# Patient Record
Sex: Female | Born: 1995 | Hispanic: Yes | Marital: Single | Smoking: Never smoker
Health system: Southern US, Community
[De-identification: ages and names within clinical notes are randomized; demographics above are authoritative.]

---
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
## 2014-12-13 ENCOUNTER — Encounter (HOSPITAL_BASED_OUTPATIENT_CLINIC_OR_DEPARTMENT_OTHER): Payer: Self-pay | Admitting: Adult Health

## 2014-12-13 ENCOUNTER — Emergency Department (HOSPITAL_BASED_OUTPATIENT_CLINIC_OR_DEPARTMENT_OTHER): Payer: Medicaid Other

## 2014-12-13 ENCOUNTER — Emergency Department (HOSPITAL_BASED_OUTPATIENT_CLINIC_OR_DEPARTMENT_OTHER)
Admission: EM | Admit: 2014-12-13 | Discharge: 2014-12-14 | Disposition: A | Discharge status: Home / Self Care | Payer: Medicaid Other | Attending: Emergency Medicine | Admitting: Emergency Medicine

## 2014-12-13 DIAGNOSIS — K5904 Chronic idiopathic constipation: Secondary | ICD-10-CM

## 2014-12-13 DIAGNOSIS — R1084 Generalized abdominal pain: Secondary | ICD-10-CM | POA: Diagnosis present

## 2014-12-13 DIAGNOSIS — Z792 Long term (current) use of antibiotics: Secondary | ICD-10-CM | POA: Insufficient documentation

## 2014-12-13 DIAGNOSIS — Z3202 Encounter for pregnancy test, result negative: Secondary | ICD-10-CM | POA: Insufficient documentation

## 2014-12-13 DIAGNOSIS — B372 Candidiasis of skin and nail: Secondary | ICD-10-CM | POA: Insufficient documentation

## 2014-12-13 DIAGNOSIS — N390 Urinary tract infection, site not specified: Secondary | ICD-10-CM | POA: Insufficient documentation

## 2014-12-13 DIAGNOSIS — K5909 Other constipation: Secondary | ICD-10-CM | POA: Insufficient documentation

## 2014-12-13 DIAGNOSIS — B369 Superficial mycosis, unspecified: Secondary | ICD-10-CM

## 2014-12-13 LAB — PREGNANCY, URINE: Preg Test, Ur: NEGATIVE

## 2014-12-13 MED ORDER — GI COCKTAIL ~~LOC~~
30.0000 mL | Freq: Once | ORAL | Status: AC
  Administered 2014-12-14: 30 mL via ORAL
  Filled 2014-12-13: qty 30

## 2014-12-13 MED ORDER — ONDANSETRON HCL 4 MG/2ML IJ SOLN: 4.0000 mg | Freq: Once | INTRAMUSCULAR | Status: DC | PRN

## 2014-12-13 MED ORDER — DICYCLOMINE HCL 10 MG/ML IM SOLN
20.0000 mg | Freq: Once | INTRAMUSCULAR | Status: AC
  Administered 2014-12-14: 20 mg via INTRAMUSCULAR
  Filled 2014-12-13: qty 2

## 2014-12-13 MED ORDER — ONDANSETRON 8 MG PO TBDP
8.0000 mg | ORAL_TABLET | Freq: Once | ORAL | Status: AC
  Administered 2014-12-14: 8 mg via ORAL
  Filled 2014-12-13: qty 1

## 2014-12-13 NOTE — ED Provider Notes (Signed)
CSN: [id number]     Arrival date & time 12/13/14  2310 History  This chart was scribed for Ziasia Lenoir, MD by Budd Palmer, ED Scribe. This patient was seen in room MH01/MH01 and the patient's care was started at 11:30 PM.    Chief Complaint  Patient presents with  . Abdominal Pain   Patient is a 19 y.o. female presenting with abdominal pain. The history is provided by the patient. No language interpreter was used.  Abdominal Pain Pain location:  Generalized Pain quality: aching and cramping   Pain radiates to:  Does not radiate Pain severity:  Moderate Onset quality:  Gradual Timing:  Constant Progression:  Worsening Chronicity:  New Context: not recent travel and not trauma   Relieved by:  None tried Worsened by:  Nothing tried Ineffective treatments:  None tried Associated symptoms: nausea   Associated symptoms: no constipation, no diarrhea, no dysuria, no fever, no vaginal bleeding and no vaginal discharge   Risk factors: has not had multiple surgeries    HPI Comments: Krystal Dunn is a 19 y.o. female who presents to the Emergency Department complaining of abdominal pain onset this morning. She reports associated erythema around the umbilicus, and intermittent nausea.. She is on BCP. Her LNMP lasted 2 weeks. Pt denies diarrhea, constipation, urinary symptoms and vaginal discharge.  History reviewed. No pertinent past medical history. History reviewed. No pertinent past surgical history. History reviewed. No pertinent family history. History  Substance Use Topics  . Smoking status: Never Smoker   . Smokeless tobacco: Not on file  . Alcohol Use: No   OB History    No data available     Review of Systems  Constitutional: Negative for fever.  Gastrointestinal: Positive for nausea and abdominal pain. Negative for diarrhea and constipation.  Genitourinary: Negative for dysuria, urgency, frequency, vaginal bleeding, vaginal discharge, difficulty urinating and pelvic  pain.  All other systems reviewed and are negative.   Allergies  Review of patient's allergies indicates no known allergies.  Home Medications   Prior to Admission medications   Medication Sig Start Date End Date Taking? Authorizing Provider  benzonatate (TESSALON) 100 MG capsule Take 1 capsule (100 mg total) by mouth every 8 (eight) hours. 04/12/13   Hope Orlene Och, NP  erythromycin ophthalmic ointment Place a 1/2 inch ribbon of ointment into the lower eyelid bid. 05/24/13   Breshay Ilg, MD  ibuprofen (ADVIL,MOTRIN) 600 MG tablet Take 1 tablet (600 mg total) by mouth every 6 (six) hours as needed. 05/24/13   Zylie Mumaw, MD  metroNIDAZOLE (FLAGYL) 500 MG tablet Take 1 tablet (500 mg total) by mouth 2 (two) times daily. One po bid x 7 days 08/14/13   Elwin Mocha, MD  ondansetron (ZOFRAN ODT) 4 MG disintegrating tablet Take 1 tablet (4 mg total) by mouth every 8 (eight) hours as needed for nausea or vomiting. 10/14/13   Teressa Lower, NP  ondansetron (ZOFRAN) 4 MG tablet Take 1 tablet (4 mg total) by mouth every 6 (six) hours. 04/12/13   Hope Orlene Och, NP  sulfamethoxazole-trimethoprim (SEPTRA DS) 800-160 MG per tablet Take 1 tablet by mouth every 12 (twelve) hours. 04/12/13   Hope Orlene Och, NP   BP 110/71 mmHg  Pulse 70  Temp(Src) 98.3 F (36.8 C) (Oral)  Resp 18  Wt 125 lb (56.7 kg)  SpO2 100% Physical Exam  Constitutional: She is oriented to person, place, and time. She appears well-developed and well-nourished. No distress.  HENT:  Head: Normocephalic and  atraumatic.  Mouth/Throat: Oropharynx is clear and moist.  Eyes: Conjunctivae and EOM are normal. Pupils are equal, round, and reactive to light.  Neck: Normal range of motion. Neck supple. No tracheal deviation present.  Cardiovascular: Normal rate, regular rhythm and normal heart sounds.   Pulmonary/Chest: Effort normal and breath sounds normal. No respiratory distress.  Lungs are clear to auscultation  Abdominal: Soft. There is  no tenderness. There is no rebound and no guarding.  Exceptionally gassy throughout. Palpable stool throughout the colon, mainly descending.  Musculoskeletal: Normal range of motion. She exhibits no edema or tenderness.  Neurological: She is alert and oriented to person, place, and time.  Skin: Skin is warm and dry.  Psychiatric: She has a normal mood and affect. Her behavior is normal.  Nursing note and vitals reviewed.   ED Course  Procedures  DIAGNOSTIC STUDIES: Oxygen Saturation is 100% on RA, normal by my interpretation.    COORDINATION OF CARE: 11:36 PM - Discussed umbilical yeast infection. Discussed plans to order antifungal cream and diagnostic studies. Pt advised of plan for treatment and pt agrees.  Labs Review Labs Reviewed  URINALYSIS, ROUTINE W REFLEX MICROSCOPIC (NOT AT [facility]) - Abnormal; Notable for the following:    APPearance CLOUDY (*)    Nitrite POSITIVE (*)    Leukocytes, UA SMALL (*)    All other components within normal limits  URINE MICROSCOPIC-ADD ON - Abnormal; Notable for the following:    Squamous Epithelial / LPF FEW (*)    Bacteria, UA MANY (*)    All other components within normal limits  PREGNANCY, URINE    Imaging Review No results found.   EKG Interpretation None      MDM   Final diagnoses:  None    Fungal infection of the umbilicus. Patient also has cramping and is extremely gassy on exam.  Constipation on exam and Xray.  Will treat fungal infection with clotrimazole cream and constipation with miralax.  Have added macrobid for UTI.    I personally performed the services described in this documentation, which was scribed in my presence. The recorded information has been reviewed and is accurate.    Cy Blamer, MD 12/14/14 [phone]

## 2014-12-13 NOTE — ED Notes (Signed)
Presents with abdominal pain at umbilicus-and on lower left side-associated with nausea and dizziness. denuis diarrhea and constipation. Pain began this AM. Inner belly button is red-she states, "It hurts and is tender around the belly button".

## 2014-12-14 ENCOUNTER — Encounter (HOSPITAL_BASED_OUTPATIENT_CLINIC_OR_DEPARTMENT_OTHER): Payer: Self-pay | Admitting: Emergency Medicine

## 2014-12-14 LAB — URINALYSIS, ROUTINE W REFLEX MICROSCOPIC
BILIRUBIN URINE: NEGATIVE
Glucose, UA: NEGATIVE mg/dL
HGB URINE DIPSTICK: NEGATIVE
Ketones, ur: NEGATIVE mg/dL
Nitrite: POSITIVE — AB
PH: 6 (ref 5.0–8.0)
Protein, ur: NEGATIVE mg/dL
SPECIFIC GRAVITY, URINE: 1.029 (ref 1.005–1.030)
Urobilinogen, UA: 0.2 mg/dL (ref 0.0–1.0)

## 2014-12-14 LAB — URINE MICROSCOPIC-ADD ON

## 2014-12-14 MED ORDER — NITROFURANTOIN MONOHYD MACRO 100 MG PO CAPS
100.0000 mg | ORAL_CAPSULE | Freq: Once | ORAL | Status: AC
  Administered 2014-12-14: 100 mg via ORAL
  Filled 2014-12-14: qty 1

## 2014-12-14 MED ORDER — NITROFURANTOIN MONOHYD MACRO 100 MG PO CAPS
100.0000 mg | ORAL_CAPSULE | Freq: Two times a day (BID) | ORAL | Status: DC
Start: 2014-12-14 — End: 2019-01-26

## 2014-12-14 MED ORDER — POLYETHYLENE GLYCOL 3350 17 GM/SCOOP PO POWD: 17.0000 g | Freq: Every day | ORAL | Status: DC

## 2014-12-14 MED ORDER — CLOTRIMAZOLE 1 % EX CREA: TOPICAL_CREAM | CUTANEOUS | Status: DC

## 2016-07-28 ENCOUNTER — Emergency Department (HOSPITAL_BASED_OUTPATIENT_CLINIC_OR_DEPARTMENT_OTHER)
Admission: EM | Admit: 2016-07-28 | Discharge: 2016-07-28 | Disposition: A | Discharge status: Home / Self Care | Payer: Medicaid Other | Attending: Emergency Medicine | Admitting: Emergency Medicine

## 2016-07-28 ENCOUNTER — Encounter (HOSPITAL_BASED_OUTPATIENT_CLINIC_OR_DEPARTMENT_OTHER): Payer: Self-pay | Admitting: *Deleted

## 2016-07-28 DIAGNOSIS — Y9389 Activity, other specified: Secondary | ICD-10-CM | POA: Diagnosis not present

## 2016-07-28 DIAGNOSIS — R1030 Lower abdominal pain, unspecified: Secondary | ICD-10-CM | POA: Diagnosis not present

## 2016-07-28 DIAGNOSIS — S3992XA Unspecified injury of lower back, initial encounter: Secondary | ICD-10-CM | POA: Diagnosis present

## 2016-07-28 DIAGNOSIS — Z79899 Other long term (current) drug therapy: Secondary | ICD-10-CM | POA: Insufficient documentation

## 2016-07-28 DIAGNOSIS — Z5321 Procedure and treatment not carried out due to patient leaving prior to being seen by health care provider: Secondary | ICD-10-CM | POA: Diagnosis not present

## 2016-07-28 DIAGNOSIS — Y92410 Unspecified street and highway as the place of occurrence of the external cause: Secondary | ICD-10-CM | POA: Diagnosis not present

## 2016-07-28 DIAGNOSIS — M549 Dorsalgia, unspecified: Secondary | ICD-10-CM | POA: Diagnosis not present

## 2016-07-28 DIAGNOSIS — Y999 Unspecified external cause status: Secondary | ICD-10-CM | POA: Insufficient documentation

## 2016-07-28 NOTE — ED Notes (Signed)
Patient upset that she is having to wait. Sitting in the wheelchair, when a new patient being brought back she is upset that she has not been taken straight back. Patient is sitting in wheelchair, texting  / looking on her phone and appears to not be in any distress.
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
## 2016-07-28 NOTE — ED Notes (Signed)
Patient approached nurses station, states she does not have time to stay and complete her evaluation, as she has to go pick up her child from school. Patient is A&Ox4, NAD noted. Ambualtory with out difficulty. Patient was advised she can return at anytime for additional evaluation.

## 2016-09-11 ENCOUNTER — Emergency Department (HOSPITAL_BASED_OUTPATIENT_CLINIC_OR_DEPARTMENT_OTHER)
Admission: EM | Admit: 2016-09-11 | Discharge: 2016-09-11 | Disposition: A | Discharge status: Home / Self Care | Payer: Medicaid Other | Attending: Emergency Medicine | Admitting: Emergency Medicine

## 2016-09-11 ENCOUNTER — Encounter (HOSPITAL_BASED_OUTPATIENT_CLINIC_OR_DEPARTMENT_OTHER): Payer: Self-pay | Admitting: Emergency Medicine

## 2016-09-11 DIAGNOSIS — J3089 Other allergic rhinitis: Secondary | ICD-10-CM | POA: Insufficient documentation

## 2016-09-11 DIAGNOSIS — R490 Dysphonia: Secondary | ICD-10-CM | POA: Diagnosis present

## 2016-09-11 MED ORDER — FLUTICASONE PROPIONATE 50 MCG/ACT NA SUSP: 2.0000 | Freq: Every day | NASAL | 0 refills | Status: DC

## 2016-09-11 MED ORDER — CETIRIZINE HCL 10 MG PO TABS: 10.0000 mg | ORAL_TABLET | Freq: Every day | ORAL | 1 refills | Status: DC

## 2016-09-11 NOTE — ED Provider Notes (Signed)
[facility] Provider Note   CSN: [id number] Arrival date & time: 09/11/16  1659  By signing my name below, I, Nelwyn Salisbury, attest that this documentation has been prepared under the direction and in the presence of non-physician practitioner, Everlene Farrier, PA-C. Electronically Signed: Nelwyn Salisbury, Scribe. 09/11/2016. 5:41 PM.  History   Chief Complaint Chief Complaint  Patient presents with  . Hoarse   The history is provided by the patient. No language interpreter was used.    HPI Comments:  Krystal Dunn is an otherwise healthy 21 y.o. female who presents to the Emergency Department complaining of sudden-onset, unchanged, lost voice and hoarseness onset 3 days. Pt reports associated rhinorrhea, sneezing, nasal congestion and post nasal drip. She reports this happens every year around this time.  She has tried Chief [employer] and Nyquil at home with no relief. Denies any fever, sore throat, cough or SOB.   History reviewed. No pertinent past medical history.  There are no active problems to display for this patient.   History reviewed. No pertinent surgical history.  OB History    No data available       Home Medications    Prior to Admission medications   Medication Sig Start Date End Date Taking? Authorizing Provider  benzonatate (TESSALON) 100 MG capsule Take 1 capsule (100 mg total) by mouth every 8 (eight) hours. 04/12/13   Janne Napoleon, NP  cetirizine (ZYRTEC ALLERGY) 10 MG tablet Take 1 tablet (10 mg total) by mouth daily. 09/11/16   Everlene Farrier, PA-C  clotrimazole (LOTRIMIN) 1 % cream Apply to affected area 2 times daily 12/14/14   Palumbo, April, MD  erythromycin ophthalmic ointment Place a 1/2 inch ribbon of ointment into the lower eyelid bid. 05/24/13   Palumbo, April, MD  fluticasone (FLONASE) 50 MCG/ACT nasal spray Place 2 sprays into both nostrils daily. 09/11/16   Everlene Farrier, PA-C  ibuprofen (ADVIL,MOTRIN) 600 MG tablet Take 1 tablet (600 mg total)  by mouth every 6 (six) hours as needed. 05/24/13   Palumbo, April, MD  metroNIDAZOLE (FLAGYL) 500 MG tablet Take 1 tablet (500 mg total) by mouth 2 (two) times daily. One po bid x 7 days 08/14/13   Elwin Mocha, MD  nitrofurantoin, macrocrystal-monohydrate, (MACROBID) 100 MG capsule Take 1 capsule (100 mg total) by mouth 2 (two) times daily. X 7 days 12/14/14   Palumbo, April, MD  ondansetron (ZOFRAN ODT) 4 MG disintegrating tablet Take 1 tablet (4 mg total) by mouth every 8 (eight) hours as needed for nausea or vomiting. 10/14/13   Teressa Lower, NP  ondansetron (ZOFRAN) 4 MG tablet Take 1 tablet (4 mg total) by mouth every 6 (six) hours. 04/12/13   Janne Napoleon, NP  polyethylene glycol powder (MIRALAX) powder Take 17 g by mouth daily. 12/14/14   Palumbo, April, MD  sulfamethoxazole-trimethoprim (SEPTRA DS) 800-160 MG per tablet Take 1 tablet by mouth every 12 (twelve) hours. 04/12/13   Janne Napoleon, NP    Family History History reviewed. No pertinent family history.  Social History Social History  Substance Use Topics  . Smoking status: Never Smoker  . Smokeless tobacco: Never Used  . Alcohol use No     Allergies   Patient has no known allergies.   Review of Systems Review of Systems  Constitutional: Negative for fever.  HENT: Positive for congestion, postnasal drip, rhinorrhea and voice change. Negative for sore throat and trouble swallowing.   Eyes: Negative for visual disturbance.  Respiratory: Negative for  cough and shortness of breath.   Skin: Negative for rash.  Neurological: Negative for light-headedness and headaches.     Physical Exam Updated Vital Signs BP 113/75 (BP Location: Right Arm)   Pulse 63   Temp 98.7 F (37.1 C) (Oral)   Resp 18   Ht 5\' 2"  (1.575 m)   Wt 59 kg   LMP 08/28/2016   SpO2 100%   BMI 23.78 kg/m   Physical Exam  Constitutional: She appears well-developed and well-nourished. No distress.  Nontoxic appearing  HENT:  Head: Normocephalic  and atraumatic.  Right Ear: Tympanic membrane and external ear normal.  Left Ear: Tympanic membrane and external ear normal.  Mouth/Throat: No oropharyngeal exudate.  No TM erythema or loss of landmarks.  Boggy nasal turbinates and rhinorrhea present bilaterally. No tonsillar hypertrophy or exudates. Some cobblestoning with evidence of postnasal drip. Uvula midline. No edema. No PTA. No drooling. No trismus.   Eyes: Conjunctivae are normal. Pupils are equal, round, and reactive to light. Right eye exhibits no discharge. Left eye exhibits no discharge.  Neck: Normal range of motion. Neck supple. No JVD present. No tracheal deviation present.  Cardiovascular: Normal rate, regular rhythm, normal heart sounds and intact distal pulses.   Pulmonary/Chest: Effort normal and breath sounds normal. No stridor. No respiratory distress. She has no wheezes. She has no rales.  Abdominal: Soft. There is no tenderness.  Lymphadenopathy:    She has no cervical adenopathy.  Neurological: She is alert. Coordination normal.  Skin: Skin is warm and dry. Capillary refill takes less than 2 seconds. No rash noted. She is not diaphoretic. No erythema. No pallor.  Psychiatric: She has a normal mood and affect. Her behavior is normal.  Nursing note and vitals reviewed.    ED Treatments / Results  DIAGNOSTIC STUDIES:  Oxygen Saturation is 100% on RA, normal by my interpretation.    COORDINATION OF CARE:  6:03 PM Discussed treatment plan with pt at bedside which includes nasal spray and pt agreed to plan.  Labs (all labs ordered are listed, but only abnormal results are displayed) Labs Reviewed - No data to display  EKG  EKG Interpretation None       Radiology No results found.  Procedures Procedures (including critical care time)  Medications Ordered in ED Medications - No data to display   Initial Impression / Assessment and Plan / ED Course  I have reviewed the triage vital signs and the  nursing notes.  Pertinent labs & imaging results that were available during my care of the patient were reviewed by me and considered in my medical decision making (see chart for details).    This is an otherwise healthy 21 y.o. female who presents to the Emergency Department complaining of sudden-onset, unchanged, lost voice and hoarseness onset 3 days. Pt reports associated rhinorrhea, sneezing, nasal congestion and post nasal drip. She reports this happens every year around this time. On exam patient is afebrile nontoxic appearing. She has evidence of allergic rhinitis with postnasal drip. Suspect this is the cause of her loss of voice. No evidence of peritonsillar abscess. She has no fevers. She denies sore throat. We'll discharge a prescription for Flonase, and Zyrtec. I encouraged her to push oral liquids. I advised the patient to follow-up with their primary care provider this week. I advised the patient to return to the emergency department with new or worsening symptoms or new concerns. The patient verbalized understanding and agreement with plan.  Final Clinical Impressions(s) / ED Diagnoses   Final diagnoses:  Seasonal allergic rhinitis due to other allergic trigger  Hoarse    New Prescriptions New Prescriptions   CETIRIZINE (ZYRTEC ALLERGY) 10 MG TABLET    Take 1 tablet (10 mg total) by mouth daily.   FLUTICASONE (FLONASE) 50 MCG/ACT NASAL SPRAY    Place 2 sprays into both nostrils daily.    I personally performed the services described in this documentation, which was scribed in my presence. The recorded information has been reviewed and is accurate.       Everlene FarrierDansie, Jaylene Schrom, PA-C 09/11/16 Delton [id number]    Long, Joshua G, MD 09/12/16 [phone]

## 2016-09-11 NOTE — ED Triage Notes (Signed)
Patient states that she has had a horse voice x 3 days. Reports that she has this every year - denies any pain or other complaints other than loosing her voice

## 2016-09-11 NOTE — ED Notes (Signed)
Pt given rx x 2 and note for work

## 2017-07-31 ENCOUNTER — Emergency Department (HOSPITAL_BASED_OUTPATIENT_CLINIC_OR_DEPARTMENT_OTHER)
Admission: EM | Admit: 2017-07-31 | Discharge: 2017-07-31 | Disposition: A | Discharge status: Home / Self Care | Payer: Medicaid Other | Attending: Emergency Medicine | Admitting: Emergency Medicine

## 2017-07-31 ENCOUNTER — Encounter (HOSPITAL_BASED_OUTPATIENT_CLINIC_OR_DEPARTMENT_OTHER): Payer: Self-pay | Admitting: Emergency Medicine

## 2017-07-31 ENCOUNTER — Other Ambulatory Visit: Payer: Self-pay

## 2017-07-31 DIAGNOSIS — Z79899 Other long term (current) drug therapy: Secondary | ICD-10-CM | POA: Diagnosis not present

## 2017-07-31 DIAGNOSIS — J069 Acute upper respiratory infection, unspecified: Secondary | ICD-10-CM | POA: Insufficient documentation

## 2017-07-31 DIAGNOSIS — J029 Acute pharyngitis, unspecified: Secondary | ICD-10-CM | POA: Diagnosis present

## 2017-07-31 DIAGNOSIS — B349 Viral infection, unspecified: Secondary | ICD-10-CM | POA: Diagnosis not present

## 2017-07-31 LAB — RAPID STREP SCREEN (MED CTR MEBANE ONLY): Streptococcus, Group A Screen (Direct): NEGATIVE

## 2017-07-31 MED ORDER — IBUPROFEN 800 MG PO TABS: 800.0000 mg | ORAL_TABLET | Freq: Three times a day (TID) | ORAL | 0 refills | Status: DC | PRN

## 2017-07-31 MED ORDER — FLUTICASONE PROPIONATE 50 MCG/ACT NA SUSP: 1.0000 | Freq: Every day | NASAL | 2 refills | Status: DC

## 2017-07-31 NOTE — ED Triage Notes (Signed)
Patient states that she is having generalized pain and sore throat starting on Saturday

## 2017-07-31 NOTE — ED Provider Notes (Signed)
[facility] EMERGENCY DEPARTMENT Provider Note   CSN: [id number] Arrival date & time: 07/31/17  1831     History   Chief Complaint Chief Complaint  Patient presents with  . Sore Throat    HPI Krystal Dunn is a 22 y.o. female without significant past medical history presents the emergency department complaining of URI symptoms for the past 4 days.  Patient states she is experiencing congestion, rhinorrhea, sore throat, subjective fevers, and bilateral ear pressure.  Patient has not tried anything for symptoms at home.  No specific alleviating or aggravating factors.  Denies  cough, dyspnea, or difficulty swallowing.  HPI  History reviewed. No pertinent past medical history.  There are no active problems to display for this patient.   History reviewed. No pertinent surgical history.   OB History   None      Home Medications    Prior to Admission medications   Medication Sig Start Date End Date Taking? Authorizing Provider  benzonatate (TESSALON) 100 MG capsule Take 1 capsule (100 mg total) by mouth every 8 (eight) hours. 04/12/13   Janne NapoleonNeese, Hope M, NP  cetirizine (ZYRTEC ALLERGY) 10 MG tablet Take 1 tablet (10 mg total) by mouth daily. 09/11/16   Everlene Farrieransie, William, PA-C  clotrimazole (LOTRIMIN) 1 % cream Apply to affected area 2 times daily 12/14/14   Palumbo, April, MD  erythromycin ophthalmic ointment Place a 1/2 inch ribbon of ointment into the lower eyelid bid. 05/24/13   Palumbo, April, MD  fluticasone (FLONASE) 50 MCG/ACT nasal spray Place 2 sprays into both nostrils daily. 09/11/16   Everlene Farrieransie, William, PA-C  ibuprofen (ADVIL,MOTRIN) 600 MG tablet Take 1 tablet (600 mg total) by mouth every 6 (six) hours as needed. 05/24/13   Palumbo, April, MD  metroNIDAZOLE (FLAGYL) 500 MG tablet Take 1 tablet (500 mg total) by mouth 2 (two) times daily. One po bid x 7 days 08/14/13   Elwin MochaWalden, Blair, MD  nitrofurantoin, macrocrystal-monohydrate, (MACROBID) 100 MG capsule Take 1 capsule  (100 mg total) by mouth 2 (two) times daily. X 7 days 12/14/14   Palumbo, April, MD  ondansetron (ZOFRAN ODT) 4 MG disintegrating tablet Take 1 tablet (4 mg total) by mouth every 8 (eight) hours as needed for nausea or vomiting. 10/14/13   Teressa LowerPickering, Vrinda, NP  ondansetron (ZOFRAN) 4 MG tablet Take 1 tablet (4 mg total) by mouth every 6 (six) hours. 04/12/13   Janne NapoleonNeese, Hope M, NP  polyethylene glycol powder (MIRALAX) powder Take 17 g by mouth daily. 12/14/14   Palumbo, April, MD  sulfamethoxazole-trimethoprim (SEPTRA DS) 800-160 MG per tablet Take 1 tablet by mouth every 12 (twelve) hours. 04/12/13   Janne NapoleonNeese, Hope M, NP    Family History History reviewed. No pertinent family history.  Social History Social History   Tobacco Use  . Smoking status: Never Smoker  . Smokeless tobacco: Never Used  Substance Use Topics  . Alcohol use: No  . Drug use: No     Allergies   Patient has no known allergies.   Review of Systems Review of Systems  Constitutional: Positive for fever (subjective). Negative for chills.  HENT: Positive for congestion, ear pain (Bilateral), rhinorrhea and sore throat. Negative for ear discharge and trouble swallowing.   Respiratory: Negative for cough and shortness of breath.   Cardiovascular: Negative for chest pain.     Physical Exam Updated Vital Signs BP 117/72   Pulse 79   Temp 98.4 F (36.9 C) (Oral)   Resp 18  Ht 5\' 2"  (1.575 m)   Wt 61.2 kg (135 lb)   LMP 07/31/2017   SpO2 100%   BMI 24.69 kg/m   Physical Exam  Constitutional: She appears well-developed and well-nourished.  Non-toxic appearance. No distress.  HENT:  Head: Normocephalic and atraumatic.  Right Ear: Tympanic membrane is not perforated, not erythematous, not retracted and not bulging.  Left Ear: Tympanic membrane is not perforated, not erythematous, not retracted and not bulging.  Nose: Mucosal edema (And congestion) present. Right sinus exhibits no maxillary sinus tenderness and no  frontal sinus tenderness. Left sinus exhibits no maxillary sinus tenderness and no frontal sinus tenderness.  Mouth/Throat: Uvula is midline. Posterior oropharyngeal erythema present. No oropharyngeal exudate. Tonsils are 2+ on the right. Tonsils are 2+ on the left.  Patient is tolerating her own secretions without difficulty.  No hot potato voice.  No drooling.  No trismus.  Some mandibular compartment is soft  Eyes: Pupils are equal, round, and reactive to light. Conjunctivae are normal. Right eye exhibits no discharge. Left eye exhibits no discharge.  Neck: Normal range of motion. Neck supple.  Cardiovascular: Normal rate and regular rhythm.  No murmur heard. Pulmonary/Chest: Breath sounds normal. No respiratory distress. She has no wheezes. She has no rales.  Abdominal: Soft. She exhibits no distension. There is no tenderness.  Lymphadenopathy:    She has cervical adenopathy (Anterior).  Neurological: She is alert.  Skin: Skin is warm and dry. No rash noted.  Psychiatric: She has a normal mood and affect. Her behavior is normal.  Nursing note and vitals reviewed.    ED Treatments / Results  Labs Results for orders placed or performed during the hospital encounter of 07/31/17  Rapid strep screen  Result Value Ref Range   Streptococcus, Group A Screen (Direct) NEGATIVE NEGATIVE   No results found.EKG None  Radiology No results found.  Procedures Procedures (including critical care time)  Medications Ordered in ED Medications - No data to display   Initial Impression / Assessment and Plan / ED Course  I have reviewed the triage vital signs and the nursing notes.  Pertinent labs & imaging results that were available during my care of the patient were reviewed by me and considered in my medical decision making (see chart for details).    Patient presents with symptoms consistent with viral illness. She is nontoxic appearing, vitals WNL. Patient is afebrile and without  adventitious sounds on lung exam, doubt PNA. No wheezing on exam. Afebrile, sxs < 7 days, no sinus tenderness, doubt acute bacterial sinusitis. Rapid strep ordered per triage negative, culture pending. .  Exam non concerning for PTA or RPA, there is no trismus, uvular deviation, or hot potato voice. Patient is tolerating her own secretions without difficulty, she has full ROM of the neck, submandibular compartment is soft. Suspect viral etiology at this time, will treat supportively with Ibuprofen and flonase.  I discussed results, treatment plan, need for PCP follow-up, and return precautions with the patient. Provided opportunity for questions, patient confirmed understanding and is in agreement with plan.    Final Clinical Impressions(s) / ED Diagnoses   Final diagnoses:  Viral upper respiratory tract infection    ED Discharge Orders        Ordered    ibuprofen (ADVIL,MOTRIN) 800 MG tablet  Every 8 hours PRN     07/31/17 2023    fluticasone (FLONASE) 50 MCG/ACT nasal spray  Daily     07/31/17 2023  Desmond Lope 07/31/17 2026    Rolland Porter, MD 08/06/17 [phone]

## 2017-07-31 NOTE — ED Notes (Signed)
Pt. C/o sore throat and body aches.  Pt. Reports no ear pain and no nausea or vomiting.

## 2017-07-31 NOTE — Discharge Instructions (Signed)
You were seen in the emergency today and diagnosed with a viral illness.  Your strep test was negative.  If the culture turns back positive we will call you.  I have prescribed you multiple medications to treat your symptoms.   -Flonase to be used 1 spray in each nostril daily.  This medication is used to treat your congestion.  -Ibuprofen to be taken once every 8 hours as needed for pain.  We have prescribed you new medication(s) today. Discuss the medications prescribed today with your pharmacist as they can have adverse effects and interactions with your other medicines including over the counter and prescribed medications. Seek medical evaluation if you start to experience new or abnormal symptoms after taking one of these medicines, seek care immediately if you start to experience difficulty breathing, feeling of your throat closing, facial swelling, or rash as these could be indications of a more serious allergic reaction   You will need to follow-up with your primary care provider in 1 week if your symptoms have not improved.  If you do not have a primary care provider one is provided in your discharge instructions-this primary care provider is in the same office as your child's pediatrician.  Return to the emergency department for any new or worsening symptoms including but not limited to difficulty breathing, chest pain, passing out, or any other concerns.

## 2017-08-03 LAB — CULTURE, GROUP A STREP (THRC)

## 2017-12-27 ENCOUNTER — Encounter (HOSPITAL_BASED_OUTPATIENT_CLINIC_OR_DEPARTMENT_OTHER): Payer: Self-pay | Admitting: *Deleted

## 2017-12-27 ENCOUNTER — Emergency Department (HOSPITAL_BASED_OUTPATIENT_CLINIC_OR_DEPARTMENT_OTHER)
Admission: EM | Admit: 2017-12-27 | Discharge: 2017-12-27 | Disposition: A | Discharge status: Home / Self Care | Payer: Medicaid Other | Attending: Emergency Medicine | Admitting: Emergency Medicine

## 2017-12-27 ENCOUNTER — Other Ambulatory Visit: Payer: Self-pay

## 2017-12-27 DIAGNOSIS — Z79899 Other long term (current) drug therapy: Secondary | ICD-10-CM | POA: Insufficient documentation

## 2017-12-27 DIAGNOSIS — N3001 Acute cystitis with hematuria: Secondary | ICD-10-CM

## 2017-12-27 DIAGNOSIS — N938 Other specified abnormal uterine and vaginal bleeding: Secondary | ICD-10-CM | POA: Diagnosis present

## 2017-12-27 LAB — URINALYSIS, MICROSCOPIC (REFLEX)

## 2017-12-27 LAB — URINALYSIS, ROUTINE W REFLEX MICROSCOPIC
Bilirubin Urine: NEGATIVE
Glucose, UA: NEGATIVE mg/dL
KETONES UR: NEGATIVE mg/dL
Leukocytes, UA: NEGATIVE
Nitrite: POSITIVE — AB
Protein, ur: NEGATIVE mg/dL
pH: 6 (ref 5.0–8.0)

## 2017-12-27 LAB — WET PREP, GENITAL
Sperm: NONE SEEN
Trich, Wet Prep: NONE SEEN
Yeast Wet Prep HPF POC: NONE SEEN

## 2017-12-27 LAB — PREGNANCY, URINE: Preg Test, Ur: NEGATIVE

## 2017-12-27 MED ORDER — CEPHALEXIN 500 MG PO CAPS: 500.0000 mg | ORAL_CAPSULE | Freq: Two times a day (BID) | ORAL | 0 refills | Status: AC

## 2017-12-27 NOTE — ED Triage Notes (Signed)
Pt reports starting her menses 2 weeks ago, pt states she is not having heavy bleeding, one pad per day, but her period continues. Denies any pain.

## 2017-12-27 NOTE — Discharge Instructions (Signed)
You have been tested for gonorrhea and chlamydia today.  If they are positive, you will be notified.    You can take Tylenol or Ibuprofen as directed for pain. You can alternate Tylenol and Ibuprofen every 4 hours. If you take Tylenol at 1pm, then you can take Ibuprofen at 5pm. Then you can take Tylenol again at 9pm.   Take antibiotics as directed. Please take all of your antibiotics until finished.  Follow-up with your OB/GYN as directed.  Return the emergency department for any worsening vaginal bleeding, pain, fever, vomiting or any other worsening or concerning symptoms.

## 2017-12-27 NOTE — ED Provider Notes (Signed)
[facility] EMERGENCY DEPARTMENT Provider Note   CSN: [id number] Arrival date & time: 12/27/17  1453     History   Chief Complaint Chief Complaint  Patient presents with  . Vaginal Bleeding    HPI Krystal Dunn is a 22 y.o. female with no significant past medical history presents for evaluation of vaginal bleeding.  Patient reports she started her period 2 weeks ago.  Since then, she has had spotting.  She states that she uses 1 pad a day and is not fully saturating it.  Patient states that she has had some discomfort with urination.  Patient states that she recently used a new soap which she does not normally use.  She denies any vaginal discharge, vaginal rash, fevers, abdominal pain.  The history is provided by the patient.    History reviewed. No pertinent past medical history.  There are no active problems to display for this patient.   History reviewed. No pertinent surgical history.   OB History   None      Home Medications    Prior to Admission medications   Medication Sig Start Date End Date Taking? Authorizing Provider  benzonatate (TESSALON) 100 MG capsule Take 1 capsule (100 mg total) by mouth every 8 (eight) hours. 04/12/13   Janne Napoleon, NP  cephALEXin (KEFLEX) 500 MG capsule Take 1 capsule (500 mg total) by mouth 2 (two) times daily for 7 days. 12/27/17 01/03/18  Maxwell Caul, PA-C  cetirizine (ZYRTEC ALLERGY) 10 MG tablet Take 1 tablet (10 mg total) by mouth daily. 09/11/16   Everlene Farrier, PA-C  clotrimazole (LOTRIMIN) 1 % cream Apply to affected area 2 times daily 12/14/14   Palumbo, April, MD  erythromycin ophthalmic ointment Place a 1/2 inch ribbon of ointment into the lower eyelid bid. 05/24/13   Palumbo, April, MD  fluticasone (FLONASE) 50 MCG/ACT nasal spray Place 1 spray into both nostrils daily. 07/31/17   Petrucelli, Samantha R, PA-C  ibuprofen (ADVIL,MOTRIN) 800 MG tablet Take 1 tablet (800 mg total) by mouth every 8 (eight) hours as  needed. 07/31/17   Petrucelli, Samantha R, PA-C  metroNIDAZOLE (FLAGYL) 500 MG tablet Take 1 tablet (500 mg total) by mouth 2 (two) times daily. One po bid x 7 days 08/14/13   Elwin Mocha, MD  nitrofurantoin, macrocrystal-monohydrate, (MACROBID) 100 MG capsule Take 1 capsule (100 mg total) by mouth 2 (two) times daily. X 7 days 12/14/14   Palumbo, April, MD  ondansetron (ZOFRAN ODT) 4 MG disintegrating tablet Take 1 tablet (4 mg total) by mouth every 8 (eight) hours as needed for nausea or vomiting. 10/14/13   Teressa Lower, NP  ondansetron (ZOFRAN) 4 MG tablet Take 1 tablet (4 mg total) by mouth every 6 (six) hours. 04/12/13   Janne Napoleon, NP  polyethylene glycol powder (MIRALAX) powder Take 17 g by mouth daily. 12/14/14   Palumbo, April, MD  sulfamethoxazole-trimethoprim (SEPTRA DS) 800-160 MG per tablet Take 1 tablet by mouth every 12 (twelve) hours. 04/12/13   Janne Napoleon, NP    Family History History reviewed. No pertinent family history.  Social History Social History   Tobacco Use  . Smoking status: Never Smoker  . Smokeless tobacco: Never Used  Substance Use Topics  . Alcohol use: No  . Drug use: No     Allergies   Patient has no known allergies.   Review of Systems Review of Systems  Constitutional: Negative for fever.  Gastrointestinal: Negative for abdominal pain.  Genitourinary: Positive for dysuria and vaginal bleeding. Negative for hematuria and vaginal discharge.  All other systems reviewed and are negative.    Physical Exam Updated Vital Signs BP 121/80 (BP Location: Right Arm)   Pulse 85   Temp 97.9 F (36.6 C) (Oral)   Resp 16   Ht 5\' 2"  (1.575 m)   Wt 63.5 kg   LMP 12/13/2017   SpO2 100%   BMI 25.61 kg/m   Physical Exam  Constitutional: She appears well-developed and well-nourished.  HENT:  Head: Normocephalic and atraumatic.  Eyes: Conjunctivae and EOM are normal. Right eye exhibits no discharge. Left eye exhibits no discharge. No scleral  icterus.  Pulmonary/Chest: Effort normal.  Abdominal: Normal appearance. There is no tenderness.  Genitourinary: Uterus normal. Cervix exhibits no motion tenderness and no discharge. Right adnexum displays no mass and no tenderness. Left adnexum displays no mass and no tenderness. There is bleeding in the vagina.  Genitourinary Comments: The exam was performed with a chaperone present. Normal external female genitalia. No lesions, rash, or sores.  Small amount of brown/dark red discharge that is consistent with blood in the vaginal vault.  Cervix is without friability, erythema.  No CMT.  No adnexal tenderness or mass noted bilaterally.  Neurological: She is alert.  Skin: Skin is warm and dry.  Psychiatric: She has a normal mood and affect. Her speech is normal and behavior is normal.  Nursing note and vitals reviewed.    ED Treatments / Results  Labs (all labs ordered are listed, but only abnormal results are displayed) Labs Reviewed  WET PREP, GENITAL - Abnormal; Notable for the following components:      Result Value   Clue Cells Wet Prep HPF POC PRESENT (*)    WBC, Wet Prep HPF POC FEW (*)    All other components within normal limits  URINALYSIS, ROUTINE W REFLEX MICROSCOPIC - Abnormal; Notable for the following components:   APPearance CLOUDY (*)    Specific Gravity, Urine >1.030 (*)    Hgb urine dipstick SMALL (*)    Nitrite POSITIVE (*)    All other components within normal limits  URINALYSIS, MICROSCOPIC (REFLEX) - Abnormal; Notable for the following components:   Bacteria, UA MANY (*)    All other components within normal limits  PREGNANCY, URINE  GC/CHLAMYDIA PROBE AMP ([place]) NOT AT [facility]    EKG None  Radiology No results found.  Procedures Procedures (including critical care time)  Medications Ordered in ED Medications - No data to display   Initial Impression / Assessment and Plan / ED Course  I have reviewed the triage vital signs and the nursing  notes.  Pertinent labs & imaging results that were available during my care of the patient were reviewed by me and considered in my medical decision making (see chart for details).     22 year old female who presents for evaluation of vaginal bleeding.  Unclear if this is vaginal bleeding or hematuria she states that she is is having some spotting and blood on toilet paper when she goes the bathroom.  Also reports some discomfort with urination.  No fevers, abdominal pain, nausea/vomiting. Patient is afebrile, non-toxic appearing, sitting comfortably on examination table. Vital signs reviewed and stable.  Consider UTI versus dysfunctional uterine bleeding.  UA ordered at triage.  UA shows small hemoglobin, positive nitrites.  Urine pregnancy negative.  Pelvic exam as documented above.  Patient has a small amount of brown/reddish discharge consistent with dried blood in the  vaginal vault.  No CMT that would be concerning for PID.  No adnexal mass or tenderness bilaterally.  We will plan to treat patient since she is symptomatic and is nitrite positive.  Patient encouraged follow-up with GYN doctor.  She states that she has an appoint with them in the next few weeks.  Patient has no adnexal mass or tenderness that would be concerning for ovarian torsion.  I suspect that she is having some dysfunctional uterine bleeding.  Discussed with patient regarding further work-up here in ED.  Offered the ultrasound for further evaluation but patient declined at this time. No indication for further workup. Patient had ample opportunity for questions and discussion. All patient's questions were answered with full understanding. Strict return precautions discussed. Patient expresses understanding and agreement to plan.   Final Clinical Impressions(s) / ED Diagnoses   Final diagnoses:  Acute cystitis with hematuria  Dysfunctional uterine bleeding    ED Discharge Orders         Ordered    cephALEXin (KEFLEX) 500 MG  capsule  2 times daily     12/27/17 1753           Maxwell Caul, PA-C 12/27/17 1852    Melene Plan, DO 12/27/17 2252

## 2017-12-28 LAB — GC/CHLAMYDIA PROBE AMP (~~LOC~~) NOT AT ARMC
Chlamydia: NEGATIVE
Neisseria Gonorrhea: NEGATIVE

## 2019-01-26 ENCOUNTER — Other Ambulatory Visit: Payer: Self-pay

## 2019-01-26 ENCOUNTER — Encounter (HOSPITAL_BASED_OUTPATIENT_CLINIC_OR_DEPARTMENT_OTHER): Payer: Self-pay | Admitting: *Deleted

## 2019-01-26 ENCOUNTER — Emergency Department (HOSPITAL_BASED_OUTPATIENT_CLINIC_OR_DEPARTMENT_OTHER)
Admission: EM | Admit: 2019-01-26 | Discharge: 2019-01-26 | Disposition: A | Discharge status: Home / Self Care | Payer: Medicaid Other | Attending: Emergency Medicine | Admitting: Emergency Medicine

## 2019-01-26 ENCOUNTER — Emergency Department (HOSPITAL_BASED_OUTPATIENT_CLINIC_OR_DEPARTMENT_OTHER): Payer: Medicaid Other

## 2019-01-26 DIAGNOSIS — M25511 Pain in right shoulder: Secondary | ICD-10-CM | POA: Insufficient documentation

## 2019-01-26 MED ORDER — NAPROXEN 500 MG PO TABS: 500.0000 mg | ORAL_TABLET | Freq: Two times a day (BID) | ORAL | 0 refills | Status: DC

## 2019-01-26 NOTE — ED Triage Notes (Signed)
Pt reports pain and limited movement in her right shoulder x 2 days. Pain is worse with movement

## 2019-01-26 NOTE — ED Provider Notes (Signed)
[facility] EMERGENCY DEPARTMENT Provider Note   CSN: [id number] Arrival date & time: 01/26/19  1422     History   Chief Complaint Chief Complaint  Patient presents with  . Shoulder Pain    HPI Krystal Dunn is a 23 y.o. female.     Patient presents to the emergency department with 2 days of right shoulder pain and decreased range of motion.  Patient states that she is having difficulty raising her arm above shoulder level.  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  She has had some radiation of pain down towards her elbow from her shoulder.  She is also had some tingling sensation in the back of her arm in the triceps area.  No treatments prior to arrival.  No other injuries.  No numbness or tingling in the hand or fingers.  No grip strength weakness.  Onset of symptoms acute.  Course is constant.  Pain is worse with movement.     History reviewed. No pertinent past medical history.  There are no active problems to display for this patient.   History reviewed. No pertinent surgical history.   OB History   No obstetric history on file.      Home Medications    Prior to Admission medications   Medication Sig Start Date End Date Taking? Authorizing Provider  naproxen (NAPROSYN) 500 MG tablet Take 1 tablet (500 mg total) by mouth 2 (two) times daily. 01/26/19   Carlisle Cater, PA-C  cetirizine (ZYRTEC ALLERGY) 10 MG tablet Take 1 tablet (10 mg total) by mouth daily. 09/11/16 01/26/19  Waynetta Pean, PA-C  fluticasone (FLONASE) 50 MCG/ACT nasal spray Place 1 spray into both nostrils daily. 07/31/17 01/26/19  Petrucelli, Glynda Jaeger, PA-C    Family History No family history on file.  Social History Social History   Tobacco Use  . Smoking status: Never Smoker  . Smokeless tobacco: Never Used  Substance Use Topics  . Alcohol use: Yes  . Drug use: No     Allergies   Patient has no known allergies.    Review of Systems Review of Systems  Constitutional: Positive for activity change.  Musculoskeletal: Positive for arthralgias. Negative for back pain, joint swelling and neck pain.  Skin: Negative for wound.  Neurological: Positive for numbness. Negative for weakness.     Physical Exam Updated Vital Signs BP 117/68 (BP Location: Left Arm)   Pulse 64   Temp 99.2 F (37.3 C) (Oral)   Resp 16   Ht 5\' 2"  (1.575 m)   Wt 72.6 kg   LMP 01/11/2019 (Approximate)   SpO2 100%   BMI 29.26 kg/m   Physical Exam Vitals signs and nursing note reviewed.  Constitutional:      Appearance: She is well-developed.  HENT:     Head: Normocephalic and atraumatic.  Eyes:     Pupils: Pupils are equal, round, and reactive to light.  Neck:     Musculoskeletal: Normal range of motion and neck supple.  Cardiovascular:     Pulses: Normal pulses. No decreased pulses.          Radial pulses are 2+ on the right side.  Musculoskeletal:        General: Tenderness present.     Right shoulder: She exhibits tenderness (Mild tenderness over the deltoid muscle on the outside of the right shoulder.  No skin findings.). She exhibits normal range of motion and no  bony tenderness.     Right elbow: Normal.    Right wrist: Normal.     Cervical back: She exhibits normal range of motion, no tenderness and no bony tenderness.     Right upper arm: She exhibits no tenderness, no bony tenderness and no swelling.  Skin:    General: Skin is warm and dry.  Neurological:     Mental Status: She is alert.     Sensory: No sensory deficit.     Comments: Motor, sensation, and vascular distal to the injury is fully intact.       ED Treatments / Results  Labs (all labs ordered are listed, but only abnormal results are displayed) Labs Reviewed - No data to display  EKG None  Radiology Dg Shoulder Right  Result Date: 01/26/2019 CLINICAL DATA:  Pain, no trauma EXAM: RIGHT SHOULDER - 2+ VIEW COMPARISON:  None. FINDINGS:  No fracture or dislocation of the right shoulder. Joint spaces are well preserved. The partially imaged right chest is unremarkable. IMPRESSION: No fracture or dislocation of the right shoulder. Joint spaces are well preserved. Electronically Signed   By: Lauralyn PrimesAlex  Bibbey M.D.   On: 01/26/2019 14:54    Procedures Procedures (including critical care time)  Medications Ordered in ED Medications - No data to display   Initial Impression / Assessment and Plan / ED Course  I have reviewed the triage vital signs and the nursing notes.  Pertinent labs & imaging results that were available during my care of the patient were reviewed by me and considered in my medical decision making (see chart for details).        Patient seen and examined.  X-ray result reviewed.  Negative for fracture or dislocation.  Symptoms consistent with deltoid injury.  Will start on anti-inflammatory regimen and have patient follow-up with sports medicine if needed.  No neck pain or cervical radiculopathy suspected at this point.  Upper extremity is neurovascularly intact.  Patient verbalizes understanding and agrees with plan.  Vital signs reviewed and are as follows: BP 117/68 (BP Location: Left Arm)   Pulse 64   Temp 99.2 F (37.3 C) (Oral)   Resp 16   Ht 5\' 2"  (1.575 m)   Wt 72.6 kg   LMP 01/11/2019 (Approximate)   SpO2 100%   BMI 29.26 kg/m    Final Clinical Impressions(s) / ED Diagnoses   Final diagnoses:  Acute pain of right shoulder    ED Discharge Orders         Ordered    naproxen (NAPROSYN) 500 MG tablet  2 times daily     01/26/19 1537           Renne CriglerGeiple, Reneisha Stilley, PA-C 01/26/19 1542    Cathren LaineSteinl, Kevin, MD 01/26/19 1635

## 2019-01-26 NOTE — Discharge Instructions (Signed)
Please read and follow all provided instructions.  Your diagnoses today include:  1. Acute pain of right shoulder     Tests performed today include:  An x-ray of the affected area - does NOT show any broken bones  Vital signs. See below for your results today.   Medications prescribed:   Naproxen - anti-inflammatory pain medication  Do not exceed 500mg  naproxen every 12 hours, take with food  You have been prescribed an anti-inflammatory medication or NSAID. Take with food. Take smallest effective dose for the shortest duration needed for your pain. Stop taking if you experience stomach pain or vomiting.   Take any prescribed medications only as directed.  Home care instructions:   Follow any educational materials contained in this packet  Follow R.I.C.E. Protocol:  R - rest your injury   I  - use ice on injury without applying directly to skin  C - compress injury with bandage or splint  E - elevate the injury as much as possible  Follow-up instructions: Please follow-up with the provided orthopedic physician (bone specialist) if you continue to have significant pain in 1 week. In this case you may have a more severe injury that requires further care.   Return instructions:   Please return if your fingers are numb or tingling, appear gray or blue, or you have severe pain (also elevate the arm and loosen splint or wrap if you were given one)  Please return to the Emergency Department if you experience worsening symptoms.   Please return if you have any other emergent concerns.  Additional Information:  Your vital signs today were: BP 117/68 (BP Location: Left Arm)    Pulse 64    Temp 99.2 F (37.3 C) (Oral)    Resp 16    Ht 5\' 2"  (1.575 m)    Wt 72.6 kg    LMP 01/11/2019 (Approximate)    SpO2 100%    BMI 29.26 kg/m  If your blood pressure (BP) was elevated above 135/85 this visit, please have this repeated by your doctor within one month. --------------

## 2019-01-26 NOTE — ED Notes (Signed)
Pt c/o R shoulder pain. Pain worse with movement. No obvious deformity. Pt able to perform ROM.

## 2019-01-26 NOTE — ED Notes (Signed)
Patient transported to X-ray 

## 2019-02-18 ENCOUNTER — Encounter (HOSPITAL_BASED_OUTPATIENT_CLINIC_OR_DEPARTMENT_OTHER): Payer: Self-pay | Admitting: Emergency Medicine

## 2019-02-18 ENCOUNTER — Other Ambulatory Visit: Payer: Self-pay

## 2019-02-18 ENCOUNTER — Emergency Department (HOSPITAL_BASED_OUTPATIENT_CLINIC_OR_DEPARTMENT_OTHER)
Admission: EM | Admit: 2019-02-18 | Discharge: 2019-02-18 | Disposition: A | Discharge status: Home / Self Care | Payer: Medicaid Other | Attending: Emergency Medicine | Admitting: Emergency Medicine

## 2019-02-18 DIAGNOSIS — M542 Cervicalgia: Secondary | ICD-10-CM | POA: Diagnosis present

## 2019-02-18 DIAGNOSIS — M549 Dorsalgia, unspecified: Secondary | ICD-10-CM

## 2019-02-18 DIAGNOSIS — M546 Pain in thoracic spine: Secondary | ICD-10-CM | POA: Insufficient documentation

## 2019-02-18 MED ORDER — METHOCARBAMOL 500 MG PO TABS: 1000.0000 mg | ORAL_TABLET | Freq: Four times a day (QID) | ORAL | 0 refills | Status: DC

## 2019-02-18 NOTE — Discharge Instructions (Signed)
Please read and follow all provided instructions.  Your diagnoses today include:  1. Upper back pain     Tests performed today include:  Vital signs - see below for your results today  Medications prescribed:   Robaxin (methocarbamol) - muscle relaxer medication  DO NOT drive or perform any activities that require you to be awake and alert because this medicine can make you drowsy.   Take any prescribed medications only as directed.  Home care instructions:   Follow any educational materials contained in this packet  Please rest, use ice or heat on your back for the next several days  Do not lift, push, pull anything more than 10 pounds for the next week  Follow-up instructions: Please follow-up with your primary care provider in the next 1 week for further evaluation of your symptoms.   Return instructions:  SEEK IMMEDIATE MEDICAL ATTENTION IF YOU HAVE:  New numbness, tingling, weakness, or problem with the use of your arms or legs  Severe back pain not relieved with medications  Loss control of your bowels or bladder  Increasing pain in any areas of the body (such as chest or abdominal pain)  Shortness of breath, dizziness, or fainting.   Worsening nausea (feeling sick to your stomach), vomiting, fever, or sweats  Any other emergent concerns regarding your health   Additional Information:  Your vital signs today were: BP 122/73 (BP Location: Right Arm)    Pulse 80    Temp 98.3 F (36.8 C) (Oral)    Resp 18    Ht 5\' 2"  (1.575 m)    Wt 74.8 kg    LMP 02/06/2019    SpO2 100%    BMI 30.18 kg/m  If your blood pressure (BP) was elevated above 135/85 this visit, please have this repeated by your doctor within one month. --------------

## 2019-02-18 NOTE — ED Provider Notes (Signed)
[facility] EMERGENCY DEPARTMENT Provider Note   CSN: [id number] Arrival date & time: 02/18/19  1011     History   Chief Complaint Chief Complaint  Patient presents with  . Neck Pain    HPI Krystal Dunn is a 23 y.o. female.     Patient presents the emergency department today with complaint of upper back pain radiating down into her spine.  I saw the patient on 9/20 for left shoulder pain.  She had a negative x-ray at that time and was prescribed naproxen.  She was given sports medicine follow-up but did not go and see them.  She has had improvement in her shoulder pain however now has pain in the middle part of her back, surrounding her spine.  Pain is worse with turning her head to the side and flexing her neck.  She states that the NSAIDs did not really help her much.  Denies any new injuries.  Denies any weakness, numbness, or tingling in her arms or legs.  No difficulty walking.  Patient denies warning symptoms of back pain including: fecal incontinence, urinary retention or overflow incontinence, night sweats, waking from sleep with back pain, unexplained fevers or weight loss, h/o cancer, IVDU, recent trauma.        History reviewed. No pertinent past medical history.  There are no active problems to display for this patient.   History reviewed. No pertinent surgical history.   OB History   No obstetric history on file.      Home Medications    Prior to Admission medications   Medication Sig Start Date End Date Taking? Authorizing Provider  naproxen (NAPROSYN) 500 MG tablet Take 1 tablet (500 mg total) by mouth 2 (two) times daily. 01/26/19   Renne Crigler, PA-C  cetirizine (ZYRTEC ALLERGY) 10 MG tablet Take 1 tablet (10 mg total) by mouth daily. 09/11/16 01/26/19  Everlene Farrier, PA-C  fluticasone (FLONASE) 50 MCG/ACT nasal spray Place 1 spray into both nostrils daily. 07/31/17 01/26/19  Petrucelli, Pleas Koch, PA-C    Family History History reviewed.  No pertinent family history.  Social History Social History   Tobacco Use  . Smoking status: Never Smoker  . Smokeless tobacco: Never Used  Substance Use Topics  . Alcohol use: Yes  . Drug use: No     Allergies   Patient has no known allergies.   Review of Systems Review of Systems  Constitutional: Negative for fever and unexpected weight change.  Gastrointestinal: Negative for constipation.       Negative for fecal incontinence.   Genitourinary: Negative for dysuria.       Negative for urinary incontinence or retention.  Musculoskeletal: Positive for back pain and neck pain.  Neurological: Negative for weakness and numbness.       Denies saddle paresthesias.     Physical Exam Updated Vital Signs BP 122/73 (BP Location: Right Arm)   Pulse 80   Temp 98.3 F (36.8 C) (Oral)   Resp 18   Ht 5\' 2"  (1.575 m)   Wt 74.8 kg   LMP 02/06/2019   SpO2 100%   BMI 30.18 kg/m   Physical Exam Vitals signs and nursing note reviewed.  Constitutional:      Appearance: She is well-developed.  HENT:     Head: Normocephalic and atraumatic.  Eyes:     Conjunctiva/sclera: Conjunctivae normal.  Neck:     Musculoskeletal: Normal range of motion and neck supple.  Pulmonary:     Effort: Pulmonary  effort is normal.  Abdominal:     Palpations: Abdomen is soft.     Tenderness: There is no abdominal tenderness.  Musculoskeletal:     Right shoulder: Normal.     Left shoulder: Normal.     Cervical back: She exhibits decreased range of motion (slight decrease with leftward rotation) and tenderness (paraspinous tenderness). She exhibits no bony tenderness.     Thoracic back: She exhibits tenderness (paraspinous tenderness upper t-spine). She exhibits normal range of motion and no bony tenderness.     Lumbar back: She exhibits normal range of motion, no tenderness and no bony tenderness.     Comments: No step-off noted with palpation of spine.  Patient reports mild pain when she looks to  her left.  Skin:    General: Skin is warm and dry.     Findings: No rash.  Neurological:     Mental Status: She is alert.     Sensory: No sensory deficit.     Deep Tendon Reflexes: Reflexes are normal and symmetric.     Comments: 5/5 strength in entire lower extremities bilaterally. No sensation deficit.       ED Treatments / Results  Labs (all labs ordered are listed, but only abnormal results are displayed) Labs Reviewed - No data to display  EKG None  Radiology No results found.  Procedures Procedures (including critical care time)  Medications Ordered in ED Medications - No data to display   Initial Impression / Assessment and Plan / ED Course  I have reviewed the triage vital signs and the nursing notes.  Pertinent labs & imaging results that were available during my care of the patient were reviewed by me and considered in my medical decision making (see chart for details).        Patient seen and examined.  Patient with symptoms consistent with musculoskeletal pain.  Discussed limited use of x-ray with patient and she agrees.  Will prescribe muscle relaxer.  Will have patient follow-up with sports medicine for further evaluation.  Vital signs reviewed and are as follows: BP 122/73 (BP Location: Right Arm)   Pulse 80   Temp 98.3 F (36.8 C) (Oral)   Resp 18   Ht 5\' 2"  (1.575 m)   Wt 74.8 kg   LMP 02/06/2019   SpO2 100%   BMI 30.18 kg/m   Patient counseled on proper use of muscle relaxant medication.  They were told not to drink alcohol, drive any vehicle, or do any dangerous activities while taking this medication.  Patient verbalized understanding.  Final Clinical Impressions(s) / ED Diagnoses   Final diagnoses:  Upper back pain   Patient with back pain. No neurological deficits or radicular features today. Patient is ambulatory. No warning symptoms of back pain including: fecal incontinence, urinary retention or overflow incontinence, night  sweats, waking from sleep with back pain, unexplained fevers or weight loss, h/o cancer, IVDU, recent trauma. No concern for cauda equina, epidural abscess, or other serious cause of back pain. Conservative measures such as rest, ice/heat and pain medicine indicated with sports medicine follow-up if no improvement with conservative management.    ED Discharge Orders         Ordered    methocarbamol (ROBAXIN) 500 MG tablet  4 times daily     02/18/19 1241           Carlisle Cater, PA-C 02/18/19 1306    Blanchie Dessert, MD 02/18/19 1600

## 2019-02-18 NOTE — ED Notes (Signed)
left shoulder pain radiating into and down spine x 1.5 weeks  Causing ha off and on  Denies inj

## 2019-02-18 NOTE — ED Triage Notes (Signed)
Pt having neck pain for two weeks.  Has been seen for same previously.  Pt state she is still having pain with movement.

## 2019-03-26 ENCOUNTER — Encounter (HOSPITAL_BASED_OUTPATIENT_CLINIC_OR_DEPARTMENT_OTHER): Payer: Self-pay | Admitting: *Deleted

## 2019-03-26 ENCOUNTER — Other Ambulatory Visit: Payer: Self-pay

## 2019-03-26 ENCOUNTER — Emergency Department (HOSPITAL_BASED_OUTPATIENT_CLINIC_OR_DEPARTMENT_OTHER)
Admission: EM | Admit: 2019-03-26 | Discharge: 2019-03-26 | Disposition: A | Discharge status: Home / Self Care | Payer: Medicaid Other | Attending: Emergency Medicine | Admitting: Emergency Medicine

## 2019-03-26 DIAGNOSIS — Z113 Encounter for screening for infections with a predominantly sexual mode of transmission: Secondary | ICD-10-CM | POA: Insufficient documentation

## 2019-03-26 DIAGNOSIS — N899 Noninflammatory disorder of vagina, unspecified: Secondary | ICD-10-CM | POA: Insufficient documentation

## 2019-03-26 DIAGNOSIS — Z711 Person with feared health complaint in whom no diagnosis is made: Secondary | ICD-10-CM

## 2019-03-26 DIAGNOSIS — N898 Other specified noninflammatory disorders of vagina: Secondary | ICD-10-CM

## 2019-03-26 LAB — WET PREP, GENITAL
Clue Cells Wet Prep HPF POC: NONE SEEN
Sperm: NONE SEEN
Trich, Wet Prep: NONE SEEN
Yeast Wet Prep HPF POC: NONE SEEN

## 2019-03-26 LAB — URINALYSIS, MICROSCOPIC (REFLEX)

## 2019-03-26 LAB — URINALYSIS, ROUTINE W REFLEX MICROSCOPIC
Bilirubin Urine: NEGATIVE
Glucose, UA: NEGATIVE mg/dL
Ketones, ur: NEGATIVE mg/dL
Leukocytes,Ua: NEGATIVE
Nitrite: NEGATIVE
Protein, ur: NEGATIVE mg/dL
Specific Gravity, Urine: >1.03 — ABNORMAL HIGH (ref 1.005–1.030)
pH: 6 (ref 5.0–8.0)

## 2019-03-26 LAB — PREGNANCY, URINE: Preg Test, Ur: NEGATIVE

## 2019-03-26 LAB — HIV ANTIBODY (ROUTINE TESTING W REFLEX): HIV Screen 4th Generation wRfx: NONREACTIVE

## 2019-03-26 MED ORDER — LIDOCAINE HCL (PF) 1 % IJ SOLN
INTRAMUSCULAR | Status: AC
  Administered 2019-03-26: 0.9 mL
  Filled 2019-03-26: qty 5

## 2019-03-26 MED ORDER — AZITHROMYCIN 1 G PO PACK
1.0000 g | PACK | Freq: Once | ORAL | Status: AC
  Administered 2019-03-26: 1 g via ORAL
  Filled 2019-03-26: qty 1

## 2019-03-26 MED ORDER — CEFTRIAXONE SODIUM 250 MG IJ SOLR
250.0000 mg | Freq: Once | INTRAMUSCULAR | Status: AC
  Administered 2019-03-26: 250 mg via INTRAMUSCULAR
  Filled 2019-03-26: qty 250

## 2019-03-26 NOTE — ED Triage Notes (Signed)
Pt c/o vaginal discharge x 2 days 

## 2019-03-26 NOTE — ED Notes (Signed)
ED Provider at bedside. 

## 2019-03-26 NOTE — Discharge Instructions (Signed)
You have been treated for gonorrhea and chlamydia in the ER but the hospital will call you if lab is positive. You were tested for HIV and Syphilis, and the hospital will call you if the lab is positive.   NO SEXUAL INTERCOURSE FOR AT LEAST 10 DAYS AFTER TODAY'S VISIT, THIS WILL INVALIDATE YOUR TREATMENT HERE. DO NOT ENGAGE IN SEXUAL ACTIVITY UNTIL YOU FIND OUT ABOUT YOUR RESULTS AND HAVE PARTNERS TESTED AND TREATED. ALL PARTNERS MUST BE TESTED AND TREATED FOR STD'S. ALWAYS USE CONDOMS WHEN ENGAGING IN INTERCOURSE.   Follow up with [facility] STD clinic for future STD concerns or screenings.  Follow up with your regular doctor in 1 week for recheck of symptoms. If you do not have one you can follow up with [facility] for primary care needs.   Go to the [facility] hospital emergency department (called the MAU) for any changes or worsening symptoms.

## 2019-03-26 NOTE — ED Provider Notes (Signed)
[facility] EMERGENCY DEPARTMENT Provider Note   CSN: [id number] Arrival date & time: 03/26/19  1420     History   Chief Complaint Chief Complaint  Patient presents with  . Vaginal Discharge    HPI Krystal Dunn is a 23 y.o. female who presents to the ED today complaining of thick white vaginal discharge x 2-3 days.  Versus history of yeast infections and states this feels similar.  She is sexually active with one female partner and is not concerned about STDs today but states that she would like to be checked for HIV and syphilis regardless.  Patient has not been taking anything for her symptoms.  She denies fevers, chills, abdominal pain, nausea, vomiting, dysuria, urinary frequency, vaginal itching, pelvic pain, vaginal bleeding, any other associated symptoms.      History reviewed. No pertinent past medical history.  There are no active problems to display for this patient.   History reviewed. No pertinent surgical history.   OB History   No obstetric history on file.      Home Medications    Prior to Admission medications   Medication Sig Start Date End Date Taking? Authorizing Provider  naproxen (NAPROSYN) 500 MG tablet Take 1 tablet (500 mg total) by mouth 2 (two) times daily. 01/26/19   Renne Crigler, PA-C  cetirizine (ZYRTEC ALLERGY) 10 MG tablet Take 1 tablet (10 mg total) by mouth daily. 09/11/16 01/26/19  Everlene Farrier, PA-C  fluticasone (FLONASE) 50 MCG/ACT nasal spray Place 1 spray into both nostrils daily. 07/31/17 01/26/19  Petrucelli, Pleas Koch, PA-C    Family History No family history on file.  Social History Social History   Tobacco Use  . Smoking status: Never Smoker  . Smokeless tobacco: Never Used  Substance Use Topics  . Alcohol use: Yes  . Drug use: No     Allergies   Patient has no known allergies.   Review of Systems Review of Systems  Constitutional: Negative for chills and fever.  HENT: Negative for congestion.    Eyes: Negative for visual disturbance.  Respiratory: Negative for shortness of breath.   Cardiovascular: Negative for chest pain.  Gastrointestinal: Negative for abdominal pain, nausea and vomiting.  Genitourinary: Positive for vaginal discharge. Negative for difficulty urinating, dysuria, flank pain, pelvic pain and vaginal bleeding.  Musculoskeletal: Negative for myalgias.  Skin: Negative for rash.  Neurological: Negative for headaches.     Physical Exam Updated Vital Signs BP 126/74 (BP Location: Right Arm)   Pulse 76   Temp 98.4 F (36.9 C)   Resp 12   Ht 5\' 2"  (1.575 m)   Wt 75.3 kg   LMP 03/09/2019   SpO2 100%   BMI 30.38 kg/m   Physical Exam Vitals signs and nursing note reviewed.  Constitutional:      Appearance: She is not ill-appearing or diaphoretic.  HENT:     Head: Normocephalic and atraumatic.  Eyes:     Conjunctiva/sclera: Conjunctivae normal.  Neck:     Musculoskeletal: Neck supple.  Cardiovascular:     Rate and Rhythm: Normal rate and regular rhythm.     Pulses: Normal pulses.  Pulmonary:     Effort: Pulmonary effort is normal.     Breath sounds: Normal breath sounds. No wheezing, rhonchi or rales.  Abdominal:     Palpations: Abdomen is soft.     Tenderness: There is no abdominal tenderness. There is no right CVA tenderness, left CVA tenderness, guarding or rebound.  Genitourinary:  Comments: Chaperone present for exam Leanora Ivanoff, RN No rashes, lesions, or tenderness to external genitalia. No erythema, injury, or tenderness to vaginal mucosa. Thick white vaginal discharge noted to vault. No adnexal masses, tenderness, or fullness. No CMT, cervical friability, or discharge from cervical os. Cervical os is closed. Uterus non-deviated, mobile, nonTTP, and without enlargement.   Skin:    General: Skin is warm and dry.  Neurological:     Mental Status: She is alert.      ED Treatments / Results  Labs (all labs ordered are listed, but  only abnormal results are displayed) Labs Reviewed  WET PREP, GENITAL - Abnormal; Notable for the following components:      Result Value   WBC, Wet Prep HPF POC MANY (*)    All other components within normal limits  URINALYSIS, ROUTINE W REFLEX MICROSCOPIC - Abnormal; Notable for the following components:   Specific Gravity, Urine >1.030 (*)    Hgb urine dipstick TRACE (*)    All other components within normal limits  URINALYSIS, MICROSCOPIC (REFLEX) - Abnormal; Notable for the following components:   Bacteria, UA MANY (*)    All other components within normal limits  PREGNANCY, URINE  RPR  HIV ANTIBODY (ROUTINE TESTING W REFLEX)  GC/CHLAMYDIA PROBE AMP ([place]) NOT AT [facility]    EKG None  Radiology No results found.  Procedures Procedures (including critical care time)  Medications Ordered in ED Medications  cefTRIAXone (ROCEPHIN) injection 250 mg (has no administration in time range)  azithromycin (ZITHROMAX) powder 1 g (has no administration in time range)  lidocaine (PF) (XYLOCAINE) 1 % injection (has no administration in time range)     Initial Impression / Assessment and Plan / ED Course  I have reviewed the triage vital signs and the nursing notes.  Pertinent labs & imaging results that were available during my care of the patient were reviewed by me and considered in my medical decision making (see chart for details).    23 year old female who presents to the ED today with vaginal discharge for the past 2 days.  Reports that she typically has vaginal discharge but it has been slightly more abundant recently with some irritation in the vaginal area.  Patient is sexually active with one female partner but states she is not concerned about STDs.  She reports history of yeast infection and states this feels similar.  Patient has no tenderness to abdomen to suggest acute abdomen today.  She has no pelvic pain.  Pelvic exam performed without adnexal or CMT tenderness to  suggest PID.  Patient does have some vaginal discharge in the vault.  Awaiting wet prep results at this time.   Wet prep negative.  Patient does have some bacteria on urine although 0-5 white blood cells per high-power field and no leuks or nitrites.  She is not pregnant.  Had lengthy discussion with patient regarding the fact that wet prep was negative and suggested empirically treating for gonorrhea and chlamydia so patient does not have to wait for results in 2 to 3 days.  Patient is in agreement with plan at this time.  She is advised to follow-up with her PCP.  Given follow-up with [facility] as she does not have PCP. Pt is in agreement with plan at this time and stable for discharge home.   This note was prepared using Dragon voice recognition software and may include unintentional dictation errors due to the inherent limitations of voice recognition  software.       Final Clinical Impressions(s) / ED Diagnoses   Final diagnoses:  Vaginal discharge  Concern about STD in female without diagnosis    ED Discharge Orders    None       Tanda RockersVenter, Anarely Nicholls, PA-C 03/26/19 1714    Vanetta MuldersZackowski, Scott, MD 03/30/19 [phone]

## 2019-03-27 LAB — RPR: RPR Ser Ql: NONREACTIVE

## 2019-03-28 LAB — GC/CHLAMYDIA PROBE AMP (~~LOC~~) NOT AT ARMC
Chlamydia: NEGATIVE
Neisseria Gonorrhea: NEGATIVE

## 2019-08-08 ENCOUNTER — Other Ambulatory Visit: Payer: Self-pay

## 2019-08-08 ENCOUNTER — Encounter (HOSPITAL_BASED_OUTPATIENT_CLINIC_OR_DEPARTMENT_OTHER): Payer: Self-pay | Admitting: *Deleted

## 2019-08-08 ENCOUNTER — Emergency Department (HOSPITAL_BASED_OUTPATIENT_CLINIC_OR_DEPARTMENT_OTHER)
Admission: EM | Admit: 2019-08-08 | Discharge: 2019-08-08 | Disposition: A | Discharge status: Home / Self Care | Payer: Medicaid Other | Attending: Emergency Medicine | Admitting: Emergency Medicine

## 2019-08-08 ENCOUNTER — Emergency Department (HOSPITAL_BASED_OUTPATIENT_CLINIC_OR_DEPARTMENT_OTHER): Payer: Medicaid Other

## 2019-08-08 DIAGNOSIS — N12 Tubulo-interstitial nephritis, not specified as acute or chronic: Secondary | ICD-10-CM | POA: Diagnosis not present

## 2019-08-08 DIAGNOSIS — R110 Nausea: Secondary | ICD-10-CM | POA: Diagnosis not present

## 2019-08-08 DIAGNOSIS — R1032 Left lower quadrant pain: Secondary | ICD-10-CM | POA: Diagnosis present

## 2019-08-08 LAB — CBC WITH DIFFERENTIAL/PLATELET
Abs Immature Granulocytes: 0.03 10*3/uL (ref 0.00–0.07)
Basophils Absolute: 0 10*3/uL (ref 0.0–0.1)
Basophils Relative: 0 %
Eosinophils Absolute: 0 10*3/uL (ref 0.0–0.5)
Eosinophils Relative: 0 %
HCT: 39.3 % (ref 36.0–46.0)
Hemoglobin: 12.7 g/dL (ref 12.0–15.0)
Immature Granulocytes: 0 %
Lymphocytes Relative: 12 %
Lymphs Abs: 1.3 10*3/uL (ref 0.7–4.0)
MCH: 30.4 pg (ref 26.0–34.0)
MCHC: 32.3 g/dL (ref 30.0–36.0)
MCV: 94 fL (ref 80.0–100.0)
Monocytes Absolute: 0.7 10*3/uL (ref 0.1–1.0)
Monocytes Relative: 6 %
Neutro Abs: 8.8 10*3/uL — ABNORMAL HIGH (ref 1.7–7.7)
Neutrophils Relative %: 82 %
Platelets: 238 10*3/uL (ref 150–400)
RBC: 4.18 MIL/uL (ref 3.87–5.11)
RDW: 13.3 % (ref 11.5–15.5)
WBC: 10.8 10*3/uL — ABNORMAL HIGH (ref 4.0–10.5)
nRBC: 0 % (ref 0.0–0.2)

## 2019-08-08 LAB — URINALYSIS, ROUTINE W REFLEX MICROSCOPIC
Bilirubin Urine: NEGATIVE
Glucose, UA: NEGATIVE mg/dL
Ketones, ur: NEGATIVE mg/dL
Nitrite: POSITIVE — AB
Protein, ur: 30 mg/dL — AB
Specific Gravity, Urine: >1.03 — ABNORMAL HIGH (ref 1.005–1.030)
pH: 6 (ref 5.0–8.0)

## 2019-08-08 LAB — URINALYSIS, MICROSCOPIC (REFLEX): WBC, UA: >50 WBC/hpf (ref 0–5)

## 2019-08-08 LAB — BASIC METABOLIC PANEL
Anion gap: 9 (ref 5–15)
BUN: 15 mg/dL (ref 6–20)
CO2: 26 mmol/L (ref 22–32)
Calcium: 9.2 mg/dL (ref 8.9–10.3)
Chloride: 101 mmol/L (ref 98–111)
Creatinine, Ser: 0.72 mg/dL (ref 0.44–1.00)
GFR calc Af Amer: >60 mL/min (ref >60)
GFR calc non Af Amer: >60 mL/min (ref >60)
Glucose, Bld: 178 mg/dL — ABNORMAL HIGH (ref 70–99)
Potassium: 3.8 mmol/L (ref 3.5–5.1)
Sodium: 136 mmol/L (ref 135–145)

## 2019-08-08 LAB — PREGNANCY, URINE: Preg Test, Ur: NEGATIVE

## 2019-08-08 MED ORDER — KETOROLAC TROMETHAMINE 15 MG/ML IJ SOLN
15.0000 mg | Freq: Once | INTRAMUSCULAR | Status: AC
  Administered 2019-08-08: 15 mg via INTRAVENOUS
  Filled 2019-08-08: qty 1

## 2019-08-08 MED ORDER — SODIUM CHLORIDE 0.9 % IV BOLUS
1000.0000 mL | Freq: Once | INTRAVENOUS | Status: AC
  Administered 2019-08-08: 1000 mL via INTRAVENOUS

## 2019-08-08 MED ORDER — NAPROXEN 500 MG PO TABS: 500.0000 mg | ORAL_TABLET | Freq: Two times a day (BID) | ORAL | 0 refills | Status: DC

## 2019-08-08 MED ORDER — ONDANSETRON HCL 4 MG/2ML IJ SOLN
4.0000 mg | Freq: Once | INTRAMUSCULAR | Status: AC
  Administered 2019-08-08: 4 mg via INTRAVENOUS
  Filled 2019-08-08: qty 2

## 2019-08-08 MED ORDER — CEPHALEXIN 500 MG PO CAPS: 500.0000 mg | ORAL_CAPSULE | Freq: Four times a day (QID) | ORAL | 0 refills | Status: DC

## 2019-08-08 MED ORDER — SODIUM CHLORIDE 0.9 % IV SOLN
1.0000 g | Freq: Once | INTRAVENOUS | Status: AC
  Administered 2019-08-08: 1 g via INTRAVENOUS
  Filled 2019-08-08: qty 10

## 2019-08-08 MED ORDER — ONDANSETRON 4 MG PO TBDP: 4.0000 mg | ORAL_TABLET | Freq: Three times a day (TID) | ORAL | 0 refills | Status: DC | PRN

## 2019-08-08 NOTE — ED Triage Notes (Signed)
Left flank pain since this am. Hx of same pain with kidney infection.

## 2019-08-08 NOTE — Discharge Instructions (Addendum)
You were seen in the emergency department today for pain to the flank area.  Your CT scan did not show any significant abnormalities.  Your labs were overall reassuring, your blood sugar was elevated at 178, please have this rechecked by primary care provider within 1 to 2 weeks.  Your urine showed findings concerning for infection and with your back pain we suspect that you have a kidney infection otherwise known as pyelonephritis.  You are sending you home with the following medicines: -Keflex: This is an antibiotic, please take this as prescribed. -Zofran: This is a medicine to help with nausea and vomiting, take every 8 hours as needed -naproxen:   - Naproxen is a nonsteroidal anti-inflammatory medication that will help with pain and swelling. Be sure to take this medication as prescribed with food, 1 pill every 12 hours,  It should be taken with food, as it can cause stomach upset, and more seriously, stomach bleeding. Do not take other nonsteroidal anti-inflammatory medications with this such as Advil, Motrin, Aleve, Mobic, Goodie Powder, or Motrin.    - Robaxin is the muscle relaxer I have prescribed, this is meant to help with muscle tightness. Be aware that this medication may make you drowsy therefore the first time you take this it should be at a time you are in an environment where you can rest. Do not drive or operate heavy machinery when taking this medication. Do not drink alcohol or take other sedating medications with this medicine such as narcotics or benzodiazepines.   You make take Tylenol per over the counter dosing with these medications.   We have prescribed you new medication(s) today. Discuss the medications prescribed today with your pharmacist as they can have adverse effects and interactions with your other medicines including over the counter and prescribed medications. Seek medical evaluation if you start to experience new or abnormal symptoms after taking one of these  medicines, seek care immediately if you start to experience difficulty breathing, feeling of your throat closing, facial swelling, or rash as these could be indications of a more serious allergic reaction  Please follow-up with your primary care provider within 3 to 5 days.  Return to the emergency department for new or worsening symptoms including but not limited to fever, worsening pain, inability to keep fluids down, or any other concerns.

## 2019-08-08 NOTE — ED Provider Notes (Signed)
[facility] EMERGENCY DEPARTMENT Provider Note   CSN: [id number] Arrival date & time: 08/08/19  1905    History Chief Complaint  Patient presents with  . Abdominal Pain    Krystal Dunn is a 24 y.o. female without significant past medical hx who presents to the ED with complaints of L flank pain that started today. Patient states pain is in the L flank, radiates into the LLQ, constant with intermittent worsening. Worse with walking, no alleviating factors. No intervention PTA. Reports associated nausea. Denies fever, chills, emesis, vaginal discharge/bleeding, dysuria, diarrhea, numbness, weakness, or incontinence. States similar sxs somewhat with prior kidney infection.  Currently sexually active in a monogamous relationship without concern for STDs.  HPI     History reviewed. No pertinent past medical history.  There are no problems to display for this patient.   History reviewed. No pertinent surgical history.   OB History   No obstetric history on file.     No family history on file.  Social History   Tobacco Use  . Smoking status: Never Smoker  . Smokeless tobacco: Never Used  Substance Use Topics  . Alcohol use: Yes  . Drug use: No    Home Medications Prior to Admission medications   Medication Sig Start Date End Date Taking? Authorizing Provider  naproxen (NAPROSYN) 500 MG tablet Take 1 tablet (500 mg total) by mouth 2 (two) times daily. 01/26/19   Carlisle Cater, PA-C  cetirizine (ZYRTEC ALLERGY) 10 MG tablet Take 1 tablet (10 mg total) by mouth daily. 09/11/16 01/26/19  Waynetta Pean, PA-C  fluticasone (FLONASE) 50 MCG/ACT nasal spray Place 1 spray into both nostrils daily. 07/31/17 01/26/19  Makhari Dovidio, Glynda Jaeger, PA-C    Allergies    Patient has no known allergies.  Review of Systems   Review of Systems  Constitutional: Negative for chills and fever.  Respiratory: Negative for cough and shortness of breath.   Cardiovascular: Negative for  chest pain.  Gastrointestinal: Positive for abdominal pain and nausea. Negative for blood in stool, constipation, diarrhea and vomiting.  Genitourinary: Positive for flank pain. Negative for dysuria, vaginal bleeding and vaginal discharge.  Neurological: Negative for weakness and numbness.       Negative for incontinence.  All other systems reviewed and are negative.   Physical Exam Updated Vital Signs BP 112/74   Pulse 71   Temp 98.2 F (36.8 C) (Oral)   Resp 20   Ht 5\' 2"  (1.575 m)   Wt 75.3 kg   LMP 08/01/2019   SpO2 99%   BMI 30.36 kg/m   Physical Exam Vitals and nursing note reviewed.  Constitutional:      General: She is not in acute distress.    Appearance: She is well-developed. She is not toxic-appearing.  HENT:     Head: Normocephalic and atraumatic.  Eyes:     General:        Right eye: No discharge.        Left eye: No discharge.     Conjunctiva/sclera: Conjunctivae normal.  Cardiovascular:     Rate and Rhythm: Normal rate and regular rhythm.  Pulmonary:     Effort: Pulmonary effort is normal. No respiratory distress.     Breath sounds: Normal breath sounds. No wheezing, rhonchi or rales.  Abdominal:     General: There is no distension.     Palpations: Abdomen is soft.     Tenderness: There is abdominal tenderness in the left lower quadrant. There is left  CVA tenderness. There is no guarding or rebound.  Musculoskeletal:     Cervical back: Neck supple.  Skin:    General: Skin is warm and dry.     Findings: No rash.  Neurological:     Mental Status: She is alert.     Comments: Clear speech.   Psychiatric:        Behavior: Behavior normal.    ED Results / Procedures / Treatments   Labs (all labs ordered are listed, but only abnormal results are displayed) Labs Reviewed  URINALYSIS, ROUTINE W REFLEX MICROSCOPIC - Abnormal; Notable for the following components:      Result Value   APPearance CLOUDY (*)    Specific Gravity, Urine >1.030 (*)    Hgb  urine dipstick SMALL (*)    Protein, ur 30 (*)    Nitrite POSITIVE (*)    Leukocytes,Ua SMALL (*)    All other components within normal limits  URINALYSIS, MICROSCOPIC (REFLEX) - Abnormal; Notable for the following components:   Bacteria, UA MANY (*)    All other components within normal limits  CBC WITH DIFFERENTIAL/PLATELET - Abnormal; Notable for the following components:   WBC 10.8 (*)    Neutro Abs 8.8 (*)    All other components within normal limits  BASIC METABOLIC PANEL - Abnormal; Notable for the following components:   Glucose, Bld 178 (*)    All other components within normal limits  URINE CULTURE  PREGNANCY, URINE    EKG None  Radiology CT Renal Stone Study  Result Date: 08/08/2019 CLINICAL DATA:  Left flank pain. EXAM: CT ABDOMEN AND PELVIS WITHOUT CONTRAST TECHNIQUE: Multidetector CT imaging of the abdomen and pelvis was performed following the standard protocol without IV contrast. COMPARISON:  None. FINDINGS: Lower chest: Lung bases are clear. Hepatobiliary: No focal liver abnormality is seen. No gallstones, gallbladder wall thickening, or biliary dilatation. Pancreas: No ductal dilatation or inflammation. Spleen: Normal in size without focal abnormality. Adrenals/Urinary Tract: Normal adrenal glands. No renal stones or hydronephrosis. Both ureters are decompressed without ureteral calculi. No significant perinephric edema. No evidence of focal renal lesion allowing for lack of IV contrast. Urinary bladder is minimally distended. No bladder wall thickening. No bladder stone. Stomach/Bowel: Stomach is unremarkable. No small bowel obstruction or obvious inflammation. Normal appendix moderate stool burden throughout the colon. No colonic wall thickening or inflammation. Vascular/Lymphatic: Abdominal aorta is normal in caliber. No bulky abdominopelvic adenopathy. Prominence of the ovarian veins, not well-defined on noncontrast exam. Reproductive: Uterus is anteverted. No  suspicious adnexal mass. Other: No free air, free fluid, or intra-abdominal fluid collection. Musculoskeletal: Transitional lumbosacral anatomy. There are no acute or suspicious osseous abnormalities. IMPRESSION: No renal stones or obstructive uropathy. No acute abnormality in the abdomen/pelvis. Electronically Signed   By: Narda Rutherford M.D.   On: 08/08/2019 20:26    Procedures Procedures (including critical care time)  Medications Ordered in ED Medications  ondansetron (ZOFRAN) injection 4 mg (4 mg Intravenous Given 08/08/19 2010)  sodium chloride 0.9 % bolus 1,000 mL (0 mLs Intravenous Stopped 08/08/19 2128)  ketorolac (TORADOL) 15 MG/ML injection 15 mg (15 mg Intravenous Given 08/08/19 2011)  cefTRIAXone (ROCEPHIN) 1 g in sodium chloride 0.9 % 100 mL IVPB (0 g Intravenous Stopped 08/08/19 2105)    ED Course  I have reviewed the triage vital signs and the nursing notes.  Pertinent labs & imaging results that were available during my care of the patient were reviewed by me and considered in my medical  decision making (see chart for details).    MDM Rules/Calculators/A&P                      Patient presents to the emergency department with left flank pain that began earlier today.  She is nontoxic, resting comfortably, vitals WNL.  On exam patient has left CVA tenderness and left lower quadrant abdominal tenderness.  No peritoneal signs.  No suprapubic tenderness.  DDx: Pyelonephritis, nephrolithiasis, MSK, diverticulitis, ectopic pregnancy.  Patient without vaginal discharge, in a monogamous relationship without concern for STI, feel PID would be less likely.  Patient states pain feels somewhat like her prior pyelonephritis, however she is concerned that something else may be going on.  We discussed option of CT renal stone study which she is in agreement with obtaining.  Analgesics, antiemetics, and fluids ordered.  CBC: Mild leukocytosis of 10.8.  No anemia. BMP: Hyperglycemia, will need  PCP recheck.  No significant electrolyte derangement.  Renal function preserved. Urinalysis: Consistent with infection. Preg test: Negative CT renal stone study: No renal stones or obstructive uropathy. No acute abnormality in the abdomen/pelvis.   Reassuring work-up in the emergency department.  With urinalysis concerning for infection will treat for presumed pyelonephritis.  Patient is nontoxic, does not appear septic, appears appropriate for discharge home with outpatient antibiotics that she is feeling much better and tolerating p.o. in the ER.  Her chart history was reviewed for further information including her most recent urine culture.  We will treat with Keflex as she has received Rocephin in the ED.  We will also provide naproxen and Zofran as needed for symptomatic treatment.  I discussed results, treatment plan, need for follow-up, and return precautions with the patient. Provided opportunity for questions, patient confirmed understanding and is in agreement with plan.    Final Clinical Impression(s) / ED Diagnoses Final diagnoses:  Pyelonephritis    Rx / DC Orders ED Discharge Orders         Ordered    cephALEXin (KEFLEX) 500 MG capsule  4 times daily     08/08/19 2143    ondansetron (ZOFRAN ODT) 4 MG disintegrating tablet  Every 8 hours PRN     08/08/19 2143    naproxen (NAPROSYN) 500 MG tablet  2 times daily     08/08/19 2143           Cherly Anderson, PA-C 08/08/19 2144    Milagros Loll, MD 08/09/19 0005

## 2019-08-11 LAB — URINE CULTURE: Culture: >=100000 — AB

## 2019-08-12 ENCOUNTER — Telehealth: Payer: Self-pay

## 2019-08-12 NOTE — Progress Notes (Signed)
ED Antimicrobial Stewardship Positive Culture Follow Up   Krystal Dunn is an 24 y.o. female who presented to [facility] on 08/08/2019 with a chief complaint of  Chief Complaint  Patient presents with  . Abdominal Pain    Recent Results (from the past 720 hour(s))  Urine culture     Status: Abnormal   Collection Time: 08/08/19  7:18 PM   Specimen: Urine, Random  Result Value Ref Range Status   Specimen Description   Final    URINE, RANDOM Performed at [facility], [address] [place], [place] [address]    Special Requests   Final    NONE Performed at [facility], [address] [place], [place] [address]    Culture >=100,000 COLONIES/mL ENTEROBACTER AEROGENES (A)  Final   Report Status 08/11/2019 FINAL  Final   Organism ID, Bacteria ENTEROBACTER AEROGENES (A)  Final      Susceptibility   Enterobacter aerogenes - MIC*    CEFAZOLIN >=64 RESISTANT Resistant     CEFTRIAXONE <=0.25 SENSITIVE Sensitive     CIPROFLOXACIN <=0.25 SENSITIVE Sensitive     GENTAMICIN <=1 SENSITIVE Sensitive     IMIPENEM 1 SENSITIVE Sensitive     NITROFURANTOIN 64 INTERMEDIATE Intermediate     TRIMETH/SULFA <=20 SENSITIVE Sensitive     PIP/TAZO <=4 SENSITIVE Sensitive     * >=100,000 COLONIES/mL ENTEROBACTER AEROGENES    [x]  Treated with cephalexin, organism resistant to prescribed antimicrobial  New antibiotic prescription: Bactrim DS 1 tablet BID x 10 days  ED Provider: , PA-C   [name] 08/12/2019, 9:44 AM Clinical Pharmacist Monday - Friday phone -  [phone] Saturday - Sunday phone - [phone]

## 2019-08-12 NOTE — Telephone Encounter (Signed)
Post ED Visit - Positive Culture Follow-up: Unsuccessful Patient Follow-up  Culture assessed and recommendations reviewed by:  []  , Pharm.D. [x]  Enzo Bi, Pharm.D., BCPS AQ-ID []  , Pharm.D., BCPS []  Celedonio Miyamoto, Pharm.D., BCPS []  [place], Garvin Fila.D., BCPS, AAHIVP []  , Pharm.D., BCPS, AAHIVP []  Georgina Pillion, PharmD []  , PharmD, BCPS  Positive urine culture Unable to reach by phone.  Needs Bactrims DE 1 BID x 10 days Per [place] PA []  Patient discharged without antimicrobial prescription and treatment is now indicated [x]  Organism is resistant to prescribed ED discharge antimicrobial []  Patient with positive blood cultures   Unable to contact patient after 3 attempts, letter will be sent to address on file  [address] 08/12/2019, 9:55 AM

## 2019-11-19 ENCOUNTER — Encounter (HOSPITAL_COMMUNITY): Payer: Self-pay | Admitting: Emergency Medicine

## 2019-11-19 ENCOUNTER — Emergency Department (HOSPITAL_COMMUNITY)
Admission: EM | Admit: 2019-11-19 | Discharge: 2019-11-19 | Disposition: A | Discharge status: Home / Self Care | Payer: Medicaid Other | Attending: Emergency Medicine | Admitting: Emergency Medicine

## 2019-11-19 DIAGNOSIS — J020 Streptococcal pharyngitis: Secondary | ICD-10-CM | POA: Diagnosis present

## 2019-11-19 LAB — GROUP A STREP BY PCR: Group A Strep by PCR: DETECTED — AB

## 2019-11-19 MED ORDER — LIDOCAINE VISCOUS HCL 2 % MT SOLN
15.0000 mL | Freq: Once | OROMUCOSAL | Status: AC
  Administered 2019-11-19: 15 mL via OROMUCOSAL
  Filled 2019-11-19: qty 15

## 2019-11-19 MED ORDER — PENICILLIN G BENZATHINE 1200000 UNIT/2ML IM SUSP
1.2000 10*6.[IU] | Freq: Once | INTRAMUSCULAR | Status: AC
  Administered 2019-11-19: 1.2 10*6.[IU] via INTRAMUSCULAR
  Filled 2019-11-19: qty 2

## 2019-11-19 MED ORDER — ACETAMINOPHEN 325 MG PO TABS
650.0000 mg | ORAL_TABLET | Freq: Once | ORAL | Status: AC
  Administered 2019-11-19: 650 mg via ORAL
  Filled 2019-11-19: qty 2

## 2019-11-19 MED ORDER — DEXAMETHASONE SODIUM PHOSPHATE 10 MG/ML IJ SOLN
10.0000 mg | Freq: Once | INTRAMUSCULAR | Status: AC
  Administered 2019-11-19: 10 mg via INTRAMUSCULAR
  Filled 2019-11-19: qty 1

## 2019-11-19 NOTE — Discharge Instructions (Addendum)

## 2019-11-19 NOTE — ED Provider Notes (Addendum)
[facility] EMERGENCY DEPARTMENT Provider Note   CSN: [id number] Arrival date & time: 11/19/19  1547     History Chief Complaint  Patient presents with  . Sore Throat    Krystal Dunn is a 24 y.o. female without significant past medical hx who presents to the ED with complaints of sore throat that started yesterday. Pain is constant, worse with swallowing, no alleviating factors. Able to swallow. Has had some nasal congestion as well. Denies fever, chills, ear pain, cough, or dyspnea. Patient denies chance of pregnancy.   HPI     History reviewed. No pertinent past medical history.  There are no problems to display for this patient.   History reviewed. No pertinent surgical history.   OB History   No obstetric history on file.     No family history on file.  Social History   Tobacco Use  . Smoking status: Never Smoker  . Smokeless tobacco: Never Used  Substance Use Topics  . Alcohol use: Yes  . Drug use: No    Home Medications Prior to Admission medications   Medication Sig Start Date End Date Taking? Authorizing Provider  cephALEXin (KEFLEX) 500 MG capsule Take 1 capsule (500 mg total) by mouth 4 (four) times daily. 08/08/19   Ahtziri Jeffries R, PA-C  naproxen (NAPROSYN) 500 MG tablet Take 1 tablet (500 mg total) by mouth 2 (two) times daily. 08/08/19   Naavya Postma R, PA-C  ondansetron (ZOFRAN ODT) 4 MG disintegrating tablet Take 1 tablet (4 mg total) by mouth every 8 (eight) hours as needed for nausea or vomiting. 08/08/19   Akin Yi, Lelon Mast R, PA-C  cetirizine (ZYRTEC ALLERGY) 10 MG tablet Take 1 tablet (10 mg total) by mouth daily. 09/11/16 01/26/19  Everlene Farrier, PA-C  fluticasone (FLONASE) 50 MCG/ACT nasal spray Place 1 spray into both nostrils daily. 07/31/17 01/26/19  Solae Norling, Pleas Koch, PA-C    Allergies    Patient has no known allergies.  Review of Systems   Review of Systems  Constitutional: Negative for chills and  fever.  HENT: Positive for congestion, sore throat and trouble swallowing (painful but able). Negative for ear pain.   Respiratory: Negative for cough and shortness of breath.   Cardiovascular: Negative for chest pain.  Gastrointestinal: Negative for abdominal pain, nausea and vomiting.    Physical Exam Updated Vital Signs BP 124/88 (BP Location: Left Arm)   Pulse 99   Temp 98.9 F (37.2 C) (Oral)   Resp 18   Ht 5\' 2"  (1.575 m)   Wt 72.6 kg   SpO2 100%   BMI 29.26 kg/m   Physical Exam Vitals and nursing note reviewed.  Constitutional:      General: She is not in acute distress.    Appearance: She is well-developed.  HENT:     Head: Normocephalic and atraumatic.     Right Ear: Ear canal normal. Tympanic membrane is not perforated, erythematous, retracted or bulging.     Left Ear: Ear canal normal. Tympanic membrane is not perforated, erythematous, retracted or bulging.     Ears:     Comments: No mastoid erythema/swelling/tenderness.     Nose:     Right Sinus: No maxillary sinus tenderness or frontal sinus tenderness.     Left Sinus: No maxillary sinus tenderness or frontal sinus tenderness.     Mouth/Throat:     Pharynx: Uvula midline. Oropharyngeal exudate and posterior oropharyngeal erythema present.     Tonsils: Tonsillar exudate present. 2+ on the  right. 2+ on the left.     Comments: Posterior oropharynx is symmetric appearing. Patient tolerating own secretions without difficulty. No trismus. No drooling. No hot potato voice. No swelling beneath the tongue, submandibular compartment is soft.  Eyes:     General:        Right eye: No discharge.        Left eye: No discharge.     Conjunctiva/sclera: Conjunctivae normal.     Pupils: Pupils are equal, round, and reactive to light.  Cardiovascular:     Rate and Rhythm: Normal rate and regular rhythm.     Heart sounds: No murmur heard.   Pulmonary:     Effort: Pulmonary effort is normal. No respiratory distress.      Breath sounds: Normal breath sounds. No wheezing, rhonchi or rales.  Abdominal:     General: There is no distension.     Palpations: Abdomen is soft.     Tenderness: There is no abdominal tenderness.  Musculoskeletal:     Cervical back: Normal range of motion and neck supple. No edema or rigidity.  Lymphadenopathy:     Cervical: Cervical adenopathy present.  Skin:    General: Skin is warm and dry.     Findings: No rash.  Neurological:     Mental Status: She is alert.  Psychiatric:        Behavior: Behavior normal.     ED Results / Procedures / Treatments   Labs (all labs ordered are listed, but only abnormal results are displayed) Labs Reviewed  GROUP A STREP BY PCR - Abnormal; Notable for the following components:      Result Value   Group A Strep by PCR DETECTED (*)    All other components within normal limits    EKG None  Radiology No results found.  Procedures Procedures (including critical care time)  Medications Ordered in ED Medications  penicillin g benzathine (BICILLIN LA) 1200000 UNIT/2ML injection 1.2 Million Units (has no administration in time range)  dexamethasone (DECADRON) injection 10 mg (has no administration in time range)  acetaminophen (TYLENOL) tablet 650 mg (650 mg Oral Given 11/19/19 1749)  lidocaine (XYLOCAINE) 2 % viscous mouth solution 15 mL (15 mLs Mouth/Throat Given 11/19/19 1748)    ED Course  I have reviewed the triage vital signs and the nursing notes.  Pertinent labs & imaging results that were available during my care of the patient were reviewed by me and considered in my medical decision making (see chart for details).    MDM Rules/Calculators/A&P                         Presents with complaint of sore throat.  Patient is nontoxic-appearing, vitals are within normal limits.  On exam patient with tonsillar erythema, exudates, and anterior cervical lymphadenopathy.  Rapid strep test is positive.  Treated in the emergency department  with IM Decadron (patient denies chance of pregnancy) and IM Penicillin.  Exam non concerning for PTA or RPA, there is no trismus, uvular deviation, or hot potato voice. Patient is tolerating own secretions without difficulty, full ROM of the neck intact, submandibular compartment is soft. Recommended use of Tylenol and Ibuprofen for any continued discomfort or fevers. I discussed results, treatment plan, need for PCP follow-up, and return precautions with the patient. Provided opportunity for questions, patient confirmed understanding and is in agreement with plan.   Final Clinical Impression(s) / ED Diagnoses Final diagnoses:  Strep throat  Rx / DC Orders ED Discharge Orders    None       Cherly Anderson, PA-C 11/19/19 1821  Syrita Dovel was evaluated in Emergency Department on 11/19/2019 for the symptoms described in the history of present illness. He/she was evaluated in the context of the global COVID-19 pandemic, which necessitated consideration that the patient might be at risk for infection with the SARS-CoV-2 virus that causes COVID-19. Institutional protocols and algorithms that pertain to the evaluation of patients at risk for COVID-19 are in a state of rapid change based on information released by regulatory bodies including the CDC and federal and state organizations. These policies and algorithms were followed during the patient's care in the ED.     Desmond Lope 11/19/19 1821    Mancel Bale, MD 11/19/19 2155

## 2019-11-19 NOTE — ED Triage Notes (Signed)
Patient in POV. Repots sore throat and nasal congestion X1 day.

## 2019-11-19 NOTE — ED Notes (Signed)
Patient given discharge instructions. Questions were answered. Patient verbalized understanding of discharge instructions and care at home.  

## 2019-11-19 NOTE — ED Provider Notes (Signed)
Patient placed in Quick Look pathway, seen and evaluated   Chief Complaint: sore throat  HPI:   Sore throat beginning yesterday. Denies fever, cough, N/V, difficulty breathing.  ROS: sore throat (one)  Physical Exam:   Gen: No distress  Neuro: Awake and Alert  Skin: Warm    Focused Exam:   Bilateral peritonsillar swelling. Cervical lymphadenopathy. Handling oral secretions without difficulty.    Initiation of care has begun. The patient has been counseled on the process, plan, and necessity for staying for the completion/evaluation, and the remainder of the medical screening examination   Concepcion Living 11/19/19 1606    Melene Plan, DO 11/19/19 1815

## 2020-02-03 ENCOUNTER — Other Ambulatory Visit: Payer: Self-pay

## 2020-02-03 ENCOUNTER — Emergency Department (HOSPITAL_BASED_OUTPATIENT_CLINIC_OR_DEPARTMENT_OTHER)
Admission: EM | Admit: 2020-02-03 | Discharge: 2020-02-04 | Disposition: A | Discharge status: Home / Self Care | Payer: Medicaid Other | Attending: Emergency Medicine | Admitting: Emergency Medicine

## 2020-02-03 ENCOUNTER — Encounter (HOSPITAL_BASED_OUTPATIENT_CLINIC_OR_DEPARTMENT_OTHER): Payer: Self-pay | Admitting: *Deleted

## 2020-02-03 DIAGNOSIS — R1084 Generalized abdominal pain: Secondary | ICD-10-CM | POA: Insufficient documentation

## 2020-02-03 DIAGNOSIS — R197 Diarrhea, unspecified: Secondary | ICD-10-CM | POA: Diagnosis not present

## 2020-02-03 DIAGNOSIS — R112 Nausea with vomiting, unspecified: Secondary | ICD-10-CM | POA: Insufficient documentation

## 2020-02-03 LAB — COMPREHENSIVE METABOLIC PANEL
ALT: 12 U/L (ref 0–44)
AST: 16 U/L (ref 15–41)
Albumin: 3.6 g/dL (ref 3.5–5.0)
Alkaline Phosphatase: 67 U/L (ref 38–126)
Anion gap: 9 (ref 5–15)
BUN: 13 mg/dL (ref 6–20)
CO2: 26 mmol/L (ref 22–32)
Calcium: 8.5 mg/dL — ABNORMAL LOW (ref 8.9–10.3)
Chloride: 103 mmol/L (ref 98–111)
Creatinine, Ser: 0.72 mg/dL (ref 0.44–1.00)
GFR calc Af Amer: >60 mL/min (ref >60)
GFR calc non Af Amer: >60 mL/min (ref >60)
Glucose, Bld: 93 mg/dL (ref 70–99)
Potassium: 3.9 mmol/L (ref 3.5–5.1)
Sodium: 138 mmol/L (ref 135–145)
Total Bilirubin: 0.6 mg/dL (ref 0.3–1.2)
Total Protein: 6.9 g/dL (ref 6.5–8.1)

## 2020-02-03 LAB — CBC WITH DIFFERENTIAL/PLATELET
Abs Immature Granulocytes: 0.01 10*3/uL (ref 0.00–0.07)
Basophils Absolute: 0 10*3/uL (ref 0.0–0.1)
Basophils Relative: 0 %
Eosinophils Absolute: 0 10*3/uL (ref 0.0–0.5)
Eosinophils Relative: 0 %
HCT: 39.1 % (ref 36.0–46.0)
Hemoglobin: 12.6 g/dL (ref 12.0–15.0)
Immature Granulocytes: 0 %
Lymphocytes Relative: 16 %
Lymphs Abs: 1.1 10*3/uL (ref 0.7–4.0)
MCH: 30 pg (ref 26.0–34.0)
MCHC: 32.2 g/dL (ref 30.0–36.0)
MCV: 93.1 fL (ref 80.0–100.0)
Monocytes Absolute: 0.6 10*3/uL (ref 0.1–1.0)
Monocytes Relative: 9 %
Neutro Abs: 4.9 10*3/uL (ref 1.7–7.7)
Neutrophils Relative %: 75 %
Platelets: 225 10*3/uL (ref 150–400)
RBC: 4.2 MIL/uL (ref 3.87–5.11)
RDW: 12.7 % (ref 11.5–15.5)
WBC: 6.6 10*3/uL (ref 4.0–10.5)
nRBC: 0 % (ref 0.0–0.2)

## 2020-02-03 LAB — LIPASE, BLOOD: Lipase: 33 U/L (ref 11–51)

## 2020-02-03 MED ORDER — MORPHINE SULFATE (PF) 4 MG/ML IV SOLN
4.0000 mg | Freq: Once | INTRAVENOUS | Status: AC
  Administered 2020-02-03: 4 mg via INTRAVENOUS
  Filled 2020-02-03: qty 1

## 2020-02-03 MED ORDER — SODIUM CHLORIDE 0.9 % IV BOLUS
1000.0000 mL | Freq: Once | INTRAVENOUS | Status: AC
  Administered 2020-02-03: 1000 mL via INTRAVENOUS

## 2020-02-03 MED ORDER — HYDROMORPHONE HCL 1 MG/ML IJ SOLN
1.0000 mg | Freq: Once | INTRAMUSCULAR | Status: AC
  Administered 2020-02-03: 1 mg via INTRAVENOUS
  Filled 2020-02-03: qty 1

## 2020-02-03 MED ORDER — ONDANSETRON HCL 4 MG/2ML IJ SOLN
4.0000 mg | Freq: Once | INTRAMUSCULAR | Status: AC
  Administered 2020-02-03: 4 mg via INTRAVENOUS
  Filled 2020-02-03: qty 2

## 2020-02-03 NOTE — ED Triage Notes (Signed)
C/o diffuse abd pain n/v/d x 1 day

## 2020-02-03 NOTE — ED Provider Notes (Signed)
[facility] EMERGENCY DEPARTMENT Provider Note   CSN: [id number] Arrival date & time: 02/03/20  2039     History Chief Complaint  Patient presents with  . Abdominal Pain    Krystal Dunn is a 24 y.o. female with past medical history significant for pyelonephritis who presents for evaluation of abdominal pain.  Patient states she has had diffuse abdominal pain throughout today. Has had persistent nausea and dry heaving however no actual emesis.  Patient states she has had multiple episodes of diarrhea without melena or bright blood per rectum.  No recent antibiotics or travel.  She rates her pain a 9/10.  She denies any fever, chills, chest pain, shortness of breath, dysuria, flank pain, pelvic pain, vaginal discharge, No concerns for STDs.  Denies additional aggravating or alleviating factors.  She is not take anything for symptoms. No COVID exposures. No UR complaints.  History obtained from patient and past medical records. No interpretor was used.  HPI     History reviewed. No pertinent past medical history.  There are no problems to display for this patient.   History reviewed. No pertinent surgical history.   OB History   No obstetric history on file.     No family history on file.  Social History   Tobacco Use  . Smoking status: Never Smoker  . Smokeless tobacco: Never Used  Substance Use Topics  . Alcohol use: Yes  . Drug use: No    Home Medications Prior to Admission medications   Medication Sig Start Date End Date Taking? Authorizing Provider  cephALEXin (KEFLEX) 500 MG capsule Take 1 capsule (500 mg total) by mouth 4 (four) times daily. 08/08/19   Petrucelli, Samantha R, PA-C  naproxen (NAPROSYN) 500 MG tablet Take 1 tablet (500 mg total) by mouth 2 (two) times daily. 08/08/19   Petrucelli, Samantha R, PA-C  ondansetron (ZOFRAN ODT) 4 MG disintegrating tablet Take 1 tablet (4 mg total) by mouth every 8 (eight) hours as needed for nausea or vomiting.  08/08/19   Petrucelli, Lelon Mast R, PA-C  cetirizine (ZYRTEC ALLERGY) 10 MG tablet Take 1 tablet (10 mg total) by mouth daily. 09/11/16 01/26/19  Everlene Farrier, PA-C  fluticasone (FLONASE) 50 MCG/ACT nasal spray Place 1 spray into both nostrils daily. 07/31/17 01/26/19  Petrucelli, Pleas Koch, PA-C    Allergies    Patient has no known allergies.  Review of Systems   Review of Systems  Constitutional: Negative.   HENT: Negative.   Respiratory: Negative.   Cardiovascular: Negative.   Gastrointestinal: Positive for abdominal pain, diarrhea and nausea. Negative for anal bleeding, blood in stool, constipation and rectal pain.  Genitourinary: Negative.   Musculoskeletal: Negative.   Skin: Negative.   Neurological: Negative.   All other systems reviewed and are negative.   Physical Exam Updated Vital Signs BP 102/67 (BP Location: Left Arm)   Pulse 61   Temp 98.3 F (36.8 C) (Oral)   Resp 18   Ht 5\' 2"  (1.575 m)   Wt 77.1 kg   LMP 01/30/2020   SpO2 100%   BMI 31.09 kg/m   Physical Exam Vitals and nursing note reviewed.  Constitutional:      General: She is not in acute distress.    Appearance: She is well-developed. She is not ill-appearing, toxic-appearing or diaphoretic.  HENT:     Head: Normocephalic and atraumatic.     Mouth/Throat:     Mouth: Mucous membranes are moist.  Eyes:     Pupils: Pupils  are equal, round, and reactive to light.  Cardiovascular:     Rate and Rhythm: Normal rate.     Heart sounds: Normal heart sounds.  Pulmonary:     Effort: Pulmonary effort is normal. No respiratory distress.     Breath sounds: Normal breath sounds.  Abdominal:     General: Bowel sounds are normal. There is no distension.     Palpations: Abdomen is soft.     Tenderness: There is generalized abdominal tenderness. There is no right CVA tenderness, left CVA tenderness, guarding or rebound. Negative signs include Murphy's sign and McBurney's sign.     Hernia: No hernia is present.    Musculoskeletal:        General: Normal range of motion.     Cervical back: Normal range of motion.  Skin:    General: Skin is warm and dry.  Neurological:     Mental Status: She is alert.    ED Results / Procedures / Treatments   Labs (all labs ordered are listed, but only abnormal results are displayed) Labs Reviewed  COMPREHENSIVE METABOLIC PANEL - Abnormal; Notable for the following components:      Result Value   Calcium 8.5 (*)    All other components within normal limits  CBC WITH DIFFERENTIAL/PLATELET  LIPASE, BLOOD  URINALYSIS, ROUTINE W REFLEX MICROSCOPIC  PREGNANCY, URINE    EKG None  Radiology No results found.  Procedures Procedures (including critical care time)  Medications Ordered in ED Medications  sodium chloride 0.9 % bolus 1,000 mL (0 mLs Intravenous Stopped 02/03/20 2320)  ondansetron (ZOFRAN) injection 4 mg (4 mg Intravenous Given 02/03/20 2213)  morphine 4 MG/ML injection 4 mg (4 mg Intravenous Given 02/03/20 2214)  HYDROmorphone (DILAUDID) injection 1 mg (1 mg Intravenous Given 02/03/20 2316)   ED Course  I have reviewed the triage vital signs and the nursing notes.  Pertinent labs & imaging results that were available during my care of the patient were reviewed by me and considered in my medical decision making (see chart for details).  24 year old presents for evaluation of generalized abdominal pain, nausea and diarrhea.  She is afebrile, nonseptic, not ill-appearing.  Symptoms began this morning.  Multiple episodes of diarrhea without melena bright blood per rectum.  Pain does not radiate into her back, pelvic region or flank. She is neurovascularly intact.  Denies any urinary complaints.  Low suspicion for pyelonephritis, urinary tract infection however will obtain UA given history.  She also denies any vaginal discharge is in a monogamous relationship and does not have any concerns for STDs.  Feel PID, torsion, TOA is less likely at this time.     Plan on labs, pain management and reassess.  Labs personally reviewed and interpreted:  CBC without leukocytosis CMP without electrolyte, renal or liver normality Lipase 33 Preg pending UA pending  Patient reassessed. Pain "somewhat" improved with Morphine. Still generalized in nature without focal pain. Pending UA and preg   Care transferred to Dr. Bebe Shaggy who will follow up on remaining labs, reassess and determine disposition.     MDM Rules/Calculators/A&P                           Final Clinical Impression(s) / ED Diagnoses Final diagnoses:  Generalized abdominal pain  Nausea vomiting and diarrhea    Rx / DC Orders ED Discharge Orders    None       Teren Franckowiak A, PA-C 02/03/20 2348  Zadie Rhine, MD 02/04/20 [phone]

## 2020-02-04 LAB — URINALYSIS, ROUTINE W REFLEX MICROSCOPIC
Bilirubin Urine: NEGATIVE
Glucose, UA: NEGATIVE mg/dL
Hgb urine dipstick: NEGATIVE
Ketones, ur: NEGATIVE mg/dL
Leukocytes,Ua: NEGATIVE
Nitrite: NEGATIVE
Protein, ur: NEGATIVE mg/dL
Specific Gravity, Urine: >1.03 — ABNORMAL HIGH (ref 1.005–1.030)
pH: 5.5 (ref 5.0–8.0)

## 2020-02-04 LAB — PREGNANCY, URINE: Preg Test, Ur: NEGATIVE

## 2020-02-04 MED ORDER — ONDANSETRON HCL 4 MG/2ML IJ SOLN
4.0000 mg | Freq: Once | INTRAMUSCULAR | Status: AC
  Administered 2020-02-04: 4 mg via INTRAVENOUS
  Filled 2020-02-04: qty 2

## 2020-02-04 MED ORDER — ONDANSETRON 8 MG PO TBDP: 8.0000 mg | ORAL_TABLET | Freq: Three times a day (TID) | ORAL | 0 refills | Status: DC | PRN

## 2020-02-04 NOTE — Discharge Instructions (Signed)

## 2020-02-04 NOTE — ED Notes (Signed)
Pt given ginger ale.

## 2020-02-04 NOTE — ED Notes (Signed)
Pt states she feels like if she tries to drink anything she will throw it back up  Pt states her pain is better right now but she still does not feel good

## 2020-02-07 ENCOUNTER — Encounter (HOSPITAL_BASED_OUTPATIENT_CLINIC_OR_DEPARTMENT_OTHER): Payer: Self-pay | Admitting: Emergency Medicine

## 2020-02-07 ENCOUNTER — Emergency Department (HOSPITAL_BASED_OUTPATIENT_CLINIC_OR_DEPARTMENT_OTHER): Payer: Medicaid Other

## 2020-02-07 ENCOUNTER — Other Ambulatory Visit: Payer: Self-pay

## 2020-02-07 ENCOUNTER — Emergency Department (HOSPITAL_BASED_OUTPATIENT_CLINIC_OR_DEPARTMENT_OTHER)
Admission: EM | Admit: 2020-02-07 | Discharge: 2020-02-07 | Disposition: A | Discharge status: Home / Self Care | Payer: Medicaid Other | Attending: Emergency Medicine | Admitting: Emergency Medicine

## 2020-02-07 DIAGNOSIS — K529 Noninfective gastroenteritis and colitis, unspecified: Secondary | ICD-10-CM

## 2020-02-07 DIAGNOSIS — R109 Unspecified abdominal pain: Secondary | ICD-10-CM | POA: Diagnosis present

## 2020-02-07 LAB — CBC WITH DIFFERENTIAL/PLATELET
Abs Immature Granulocytes: 0.02 10*3/uL (ref 0.00–0.07)
Basophils Absolute: 0 10*3/uL (ref 0.0–0.1)
Basophils Relative: 0 %
Eosinophils Absolute: 0 10*3/uL (ref 0.0–0.5)
Eosinophils Relative: 1 %
HCT: 39.1 % (ref 36.0–46.0)
Hemoglobin: 12.8 g/dL (ref 12.0–15.0)
Immature Granulocytes: 0 %
Lymphocytes Relative: 35 %
Lymphs Abs: 2.1 10*3/uL (ref 0.7–4.0)
MCH: 29.5 pg (ref 26.0–34.0)
MCHC: 32.7 g/dL (ref 30.0–36.0)
MCV: 90.1 fL (ref 80.0–100.0)
Monocytes Absolute: 0.6 10*3/uL (ref 0.1–1.0)
Monocytes Relative: 10 %
Neutro Abs: 3.2 10*3/uL (ref 1.7–7.7)
Neutrophils Relative %: 54 %
Platelets: 289 10*3/uL (ref 150–400)
RBC: 4.34 MIL/uL (ref 3.87–5.11)
RDW: 12.3 % (ref 11.5–15.5)
WBC: 6 10*3/uL (ref 4.0–10.5)
nRBC: 0 % (ref 0.0–0.2)

## 2020-02-07 LAB — URINALYSIS, ROUTINE W REFLEX MICROSCOPIC
Bilirubin Urine: NEGATIVE
Glucose, UA: NEGATIVE mg/dL
Hgb urine dipstick: NEGATIVE
Ketones, ur: NEGATIVE mg/dL
Nitrite: NEGATIVE
Protein, ur: NEGATIVE mg/dL
Specific Gravity, Urine: 1.025 (ref 1.005–1.030)
pH: 7 (ref 5.0–8.0)

## 2020-02-07 LAB — LIPASE, BLOOD: Lipase: 28 U/L (ref 11–51)

## 2020-02-07 LAB — PREGNANCY, URINE: Preg Test, Ur: NEGATIVE

## 2020-02-07 LAB — COMPREHENSIVE METABOLIC PANEL
ALT: 15 U/L (ref 0–44)
AST: 18 U/L (ref 15–41)
Albumin: 4.1 g/dL (ref 3.5–5.0)
Alkaline Phosphatase: 71 U/L (ref 38–126)
Anion gap: 10 (ref 5–15)
BUN: 14 mg/dL (ref 6–20)
CO2: 26 mmol/L (ref 22–32)
Calcium: 9.3 mg/dL (ref 8.9–10.3)
Chloride: 100 mmol/L (ref 98–111)
Creatinine, Ser: 0.65 mg/dL (ref 0.44–1.00)
GFR calc Af Amer: >60 mL/min (ref >60)
GFR calc non Af Amer: >60 mL/min (ref >60)
Glucose, Bld: 90 mg/dL (ref 70–99)
Potassium: 4 mmol/L (ref 3.5–5.1)
Sodium: 136 mmol/L (ref 135–145)
Total Bilirubin: 0.5 mg/dL (ref 0.3–1.2)
Total Protein: 7.6 g/dL (ref 6.5–8.1)

## 2020-02-07 LAB — URINALYSIS, MICROSCOPIC (REFLEX): RBC / HPF: NONE SEEN RBC/hpf (ref 0–5)

## 2020-02-07 MED ORDER — IOHEXOL 300 MG/ML  SOLN
100.0000 mL | Freq: Once | INTRAMUSCULAR | Status: AC | PRN
  Administered 2020-02-07: 100 mL via INTRAVENOUS

## 2020-02-07 MED ORDER — MORPHINE SULFATE (PF) 4 MG/ML IV SOLN
4.0000 mg | Freq: Once | INTRAVENOUS | Status: AC
  Administered 2020-02-07: 4 mg via INTRAVENOUS
  Filled 2020-02-07: qty 1

## 2020-02-07 MED ORDER — ONDANSETRON HCL 4 MG/2ML IJ SOLN
4.0000 mg | Freq: Once | INTRAMUSCULAR | Status: AC
  Administered 2020-02-07: 4 mg via INTRAVENOUS
  Filled 2020-02-07: qty 2

## 2020-02-07 MED ORDER — SODIUM CHLORIDE 0.9 % IV BOLUS
1000.0000 mL | Freq: Once | INTRAVENOUS | Status: AC
  Administered 2020-02-07: 1000 mL via INTRAVENOUS

## 2020-02-07 MED ORDER — DICYCLOMINE HCL 20 MG PO TABS: 20.0000 mg | ORAL_TABLET | Freq: Four times a day (QID) | ORAL | 0 refills | Status: AC | PRN

## 2020-02-07 NOTE — ED Triage Notes (Signed)
Generalized abd pain with nausea. Was seen 9/28, no improvement.

## 2020-02-07 NOTE — ED Notes (Signed)
Abdominal pain since Sept 28th, rates pain a 10 on 0-10 scale, having cramping, sharp pain with nausea, also having vomiting and having diarrhea as well. Last vomited prior to arrival , last BM was yesterday. Denies any abd surgeries, appetite is poor, does get hungry, will try to eat and becomes immediately nauseated.

## 2020-02-07 NOTE — ED Notes (Signed)
ED Provider at bedside. 

## 2020-02-07 NOTE — Discharge Instructions (Signed)
Your CT scan today shows you likely have colitis.  Based on your lab work and lack of fever this is likely a viral illness.  Antibiotics are not needed at this time.  A prescription has been sent to your pharmacy for Bentyl.  This can be given to help with abdominal pain.  Take as prescribed.  You can also take over-the-counter Imodium if needed to help with your diarrhea.  Take as record on the bottle.  Follow-up with the stomach doctors (GI).  Dr. Matthias Hughs is on-call.  Call his office first thing Monday morning and ask to schedule a follow-up emergency room visit.

## 2020-02-07 NOTE — ED Notes (Signed)
Placed on cont POX with int NBP assessments, sr x 2 up , call bell within reach, pt instructed not to get up without staff in room, lights dimmed for comfort as well.

## 2020-02-07 NOTE — ED Notes (Signed)
Ginger Ale provided for po challenge 

## 2020-02-07 NOTE — ED Notes (Signed)
States having normal monthly cycles and just finished her menstrual cycle. Denies being pregnant at this time.

## 2020-02-07 NOTE — ED Notes (Signed)
AVS provided to client, pt teaching done in regards to Diet changes for her illness, also discussed the medication Rx that was electronically sent to her pharmacy of choice, also encouraged client to make a follow up appt with the Referred GI MD. Opportunity for questions provided as well

## 2020-02-07 NOTE — ED Provider Notes (Signed)
[facility] EMERGENCY DEPARTMENT Provider Note   CSN: [id number] Arrival date & time: 02/07/20  1417     History Chief Complaint  Patient presents with   Abdominal Pain   Krystal Dunn is a 24 y.o. female with past medical history significant for pyelonephritis. No abdominal surgical history.    HPI Presents to emergency department today with chief complaint of intermittent abdominal pain x2 months.  She is reporting pain has significantly worsened in the last x 4 days.  She is endorsing associated nausea as well as one episode of nonbloody nonbilious emesis and 3 episodes of liquid stool in the last 24 hours.  She states her abdominal pain is located throughout her entire abdomen.  She states it feels similar to menstrual cramps however much more severe.  She rates the pain 10 of 10 in severity.  LMP ended x4 days ago. She has poor PO intake because of nausea. She denies any recent antibiotic use or travel.  She is sexually active with 1 female partner without protection.  Not concerned for STIs. She denies any fever, chills, chest pain, shortness of breath, dysuria, flank pain, pelvic pain, abnormal vaginal bleeding, vaginal discharge, urinary frequency, blood in stool. Patient was seen in the ED for the same abdominal pain x4 days ago. She has unremarkable labs.  Was discharged home and there was low suspicion for acute abdominal emergency/appendicitis per provider note.  Patient states she returned because she was told to do so if her pain did not improve.    History reviewed. No pertinent past medical history.  There are no problems to display for this patient.   History reviewed. No pertinent surgical history.   OB History   No obstetric history on file.     No family history on file.  Social History   Tobacco Use   Smoking status: Never Smoker   Smokeless tobacco: Never Used  Substance Use Topics   Alcohol use: Yes   Drug use: No    Home  Medications Prior to Admission medications   Medication Sig Start Date End Date Taking? Authorizing Provider  dicyclomine (BENTYL) 20 MG tablet Take 1 tablet (20 mg total) by mouth 4 (four) times daily as needed. 02/07/20   Pat Sires E, PA-C  ondansetron (ZOFRAN ODT) 8 MG disintegrating tablet Take 1 tablet (8 mg total) by mouth every 8 (eight) hours as needed. 02/04/20   Zadie Rhine, MD  cetirizine (ZYRTEC ALLERGY) 10 MG tablet Take 1 tablet (10 mg total) by mouth daily. 09/11/16 01/26/19  Everlene Farrier, PA-C  fluticasone (FLONASE) 50 MCG/ACT nasal spray Place 1 spray into both nostrils daily. 07/31/17 01/26/19  Petrucelli, Pleas Koch, PA-C    Allergies    Patient has no known allergies.  Review of Systems   Review of Systems All other systems are reviewed and are negative for acute change except as noted in the HPI.  Physical Exam Updated Vital Signs BP 106/69 (BP Location: Left Arm)    Pulse 66    Temp 98 F (36.7 C) (Oral)    Resp 16    Ht 5\' 2"  (1.575 m)    Wt 77.1 kg    LMP 01/30/2020    SpO2 99%    BMI 31.09 kg/m   Physical Exam Vitals and nursing note reviewed.  Constitutional:      General: She is not in acute distress.    Appearance: She is not ill-appearing.  HENT:     Head: Normocephalic and  atraumatic.     Right Ear: Tympanic membrane and external ear normal.     Left Ear: Tympanic membrane and external ear normal.     Nose: Nose normal.     Mouth/Throat:     Mouth: Mucous membranes are moist.     Pharynx: Oropharynx is clear.  Eyes:     General: No scleral icterus.       Right eye: No discharge.        Left eye: No discharge.     Extraocular Movements: Extraocular movements intact.     Conjunctiva/sclera: Conjunctivae normal.     Pupils: Pupils are equal, round, and reactive to light.  Neck:     Vascular: No JVD.  Cardiovascular:     Rate and Rhythm: Normal rate and regular rhythm.     Pulses: Normal pulses.          Radial pulses are 2+ on the  right side and 2+ on the left side.     Heart sounds: Normal heart sounds.  Pulmonary:     Comments: Lungs clear to auscultation in all fields. Symmetric chest rise. No wheezing, rales, or rhonchi. Abdominal:     Comments: Abdomen is soft, non-distended.  Tenderness to palpation of epigastric area.  No rigidity, no guarding. No peritoneal signs.  No CVA tenderness.  Musculoskeletal:        General: Normal range of motion.     Cervical back: Normal range of motion.  Skin:    General: Skin is warm and dry.     Capillary Refill: Capillary refill takes less than 2 seconds.  Neurological:     Mental Status: She is oriented to person, place, and time.     GCS: GCS eye subscore is 4. GCS verbal subscore is 5. GCS motor subscore is 6.     Comments: Fluent speech, no facial droop.  Psychiatric:        Behavior: Behavior normal.       ED Results / Procedures / Treatments   Labs (all labs ordered are listed, but only abnormal results are displayed) Labs Reviewed  URINALYSIS, ROUTINE W REFLEX MICROSCOPIC - Abnormal; Notable for the following components:      Result Value   APPearance CLOUDY (*)    Leukocytes,Ua SMALL (*)    All other components within normal limits  URINALYSIS, MICROSCOPIC (REFLEX) - Abnormal; Notable for the following components:   Bacteria, UA MANY (*)    All other components within normal limits  URINE CULTURE  COMPREHENSIVE METABOLIC PANEL  CBC WITH DIFFERENTIAL/PLATELET  LIPASE, BLOOD  PREGNANCY, URINE    EKG None  Radiology CT ABDOMEN PELVIS W CONTRAST  Result Date: 02/07/2020 CLINICAL DATA:  Abdominal pain. EXAM: CT ABDOMEN AND PELVIS WITH CONTRAST TECHNIQUE: Multidetector CT imaging of the abdomen and pelvis was performed using the standard protocol following bolus administration of intravenous contrast. CONTRAST:  100mL OMNIPAQUE IOHEXOL 300 MG/ML  SOLN COMPARISON:  August 08, 2019 FINDINGS: Lower chest: No acute abnormality. Hepatobiliary: No focal liver  abnormality is seen. No gallstones, gallbladder wall thickening, or biliary dilatation. Pancreas: Unremarkable. No pancreatic ductal dilatation or surrounding inflammatory changes. Spleen: Normal in size without focal abnormality. Adrenals/Urinary Tract: Adrenal glands are unremarkable. Kidneys are normal, without renal calculi, focal lesion, or hydronephrosis. Bladder is unremarkable. Stomach/Bowel: Stomach is within normal limits. Appendix appears normal. No evidence of bowel dilatation. Mild to moderate severity thickening of the ascending colon is seen. Vascular/Lymphatic: No significant vascular findings are present. Subcentimeter mesenteric  lymph nodes are seen within the mid right abdomen right lower quadrant. Reproductive: The uterus is normal in size and slightly heterogeneous in appearance. Other: No abdominal wall hernia or abnormality. No abdominopelvic ascites. Musculoskeletal: No acute or significant osseous findings. IMPRESSION: Colonic wall thickening which may be secondary to mild to moderate severity colitis involving the ascending colon. Electronically Signed   By: Aram Candela M.D.   On: 02/07/2020 16:42    Procedures Procedures (including critical care time)  Medications Ordered in ED Medications  sodium chloride 0.9 % bolus 1,000 mL (0 mLs Intravenous Stopped 02/07/20 1631)  ondansetron (ZOFRAN) injection 4 mg (4 mg Intravenous Given 02/07/20 1516)  morphine 4 MG/ML injection 4 mg (4 mg Intravenous Given 02/07/20 1517)  iohexol (OMNIPAQUE) 300 MG/ML solution 100 mL (100 mLs Intravenous Contrast Given 02/07/20 1619)    ED Course  I have reviewed the triage vital signs and the nursing notes.  Pertinent labs & imaging results that were available during my care of the patient were reviewed by me and considered in my medical decision making (see chart for details).    MDM Rules/Calculators/A&P                          History provided by patient with additional history  obtained from chart review.    Patient presents to the ED with complaints of abdominal pain. Patient nontoxic appearing, in no apparent distress, vitals WNL. On exam patient tender to epigastric area, no peritoneal signs.  She does not appear dehydrated.  Will evaluate with labs and.  Analgesics, anti-emetics, and fluids administered.   Labs reviewed and grossly unremarkable. No leukocytosis, no anemia, no significant electrolyte derangements. LFTs, renal function, and lipase WNL.  Pregnancy test is negative.  Urinalysis without obvious infection.  UA has small leukocytes, 6-10 WBC and many bacteria.  There is also squamous epithelial cells present suggesting contaminated sample.  Patient has no urinary symptoms at this time.  Will send for urine culture.  Engaged in shared decision making with patient as this is her second ED visit for abdominal pain 4 days.  On repeat abdominal exam she continues to have epigastric pain.  Unfortunately at this time ultrasound is unavailable at this facility.  She would like to proceed with imaging for CT.  CT abdomen pelvis performed and shows colonic wall thickening mild to moderate colitis involving the ascending colon.  Patient afebrile here and denies any fevers or chills at home.  No leukocytosis, given she has had worsening pain x4 days at this time if there was bacterial etiology would expect her work-up today to reflect that.  Stool studies were ordered however patient had no further episodes of diarrhea while here in the department therefore orders were canceled. I discussed results with the patient.  She is tolerating p.o. intake.  On repeat abdominal exam she has no peritoneal signs.  Will treat symptomatically with Bentyl.  She already has prescription for Zofran at home. I discussed all results, treatment plan, need for PCP and GI follow-up, and return precautions with the patient. Provided opportunity for questions, patient confirmed understanding and is in  agreement with plan.   Portions of this note were generated with Scientist, clinical (histocompatibility and immunogenetics). Dictation errors may occur despite best attempts at proofreading.    Final Clinical Impression(s) / ED Diagnoses Final diagnoses:  Colitis    Rx / DC Orders ED Discharge Orders  Ordered    dicyclomine (BENTYL) 20 MG tablet  4 times daily PRN        02/07/20 1706           Sherene Sires, PA-C 02/07/20 1729    Sabas Sous, MD 02/08/20 [phone]

## 2020-02-07 NOTE — ED Notes (Signed)
Pt informed that urine spec was also requested. WIll also keep client NPO until further notice

## 2020-02-09 LAB — URINE CULTURE: Culture: NO GROWTH

## 2020-05-11 ENCOUNTER — Other Ambulatory Visit: Payer: Self-pay

## 2020-05-11 ENCOUNTER — Emergency Department (HOSPITAL_BASED_OUTPATIENT_CLINIC_OR_DEPARTMENT_OTHER)
Admission: EM | Admit: 2020-05-11 | Discharge: 2020-05-11 | Disposition: A | Discharge status: Home / Self Care | Payer: Medicaid Other | Attending: Emergency Medicine | Admitting: Emergency Medicine

## 2020-05-11 ENCOUNTER — Encounter (HOSPITAL_BASED_OUTPATIENT_CLINIC_OR_DEPARTMENT_OTHER): Payer: Self-pay | Admitting: Emergency Medicine

## 2020-05-11 DIAGNOSIS — R070 Pain in throat: Secondary | ICD-10-CM | POA: Diagnosis not present

## 2020-05-11 DIAGNOSIS — R1031 Right lower quadrant pain: Secondary | ICD-10-CM | POA: Insufficient documentation

## 2020-05-11 DIAGNOSIS — S76011A Strain of muscle, fascia and tendon of right hip, initial encounter: Secondary | ICD-10-CM

## 2020-05-11 DIAGNOSIS — J029 Acute pharyngitis, unspecified: Secondary | ICD-10-CM

## 2020-05-11 DIAGNOSIS — R10813 Right lower quadrant abdominal tenderness: Secondary | ICD-10-CM | POA: Insufficient documentation

## 2020-05-11 MED ORDER — PREDNISONE 20 MG PO TABS
20.0000 mg | ORAL_TABLET | Freq: Once | ORAL | Status: AC
  Administered 2020-05-11: 20 mg via ORAL
  Filled 2020-05-11: qty 1

## 2020-05-11 MED ORDER — TRAMADOL HCL 50 MG PO TABS: 50.0000 mg | ORAL_TABLET | Freq: Four times a day (QID) | ORAL | 0 refills | Status: AC | PRN

## 2020-05-11 MED ORDER — TRAMADOL HCL 50 MG PO TABS
50.0000 mg | ORAL_TABLET | Freq: Once | ORAL | Status: AC
  Administered 2020-05-11: 50 mg via ORAL
  Filled 2020-05-11: qty 1

## 2020-05-11 MED ORDER — PREDNISONE 10 MG PO TABS: 20.0000 mg | ORAL_TABLET | Freq: Two times a day (BID) | ORAL | 0 refills | Status: AC

## 2020-05-11 NOTE — ED Provider Notes (Signed)
[facility] EMERGENCY DEPARTMENT Provider Note   CSN: [id number] Arrival date & time: 05/11/20  0126     History Chief Complaint  Patient presents with  . Abdominal Pain    Krystal Dunn is a 25 y.o. female.  Patient is a 25 year old female with no significant past medical history.  She presents today for evaluation of pain in her right groin/right lower quadrant.  This has been present for the past 5 days.  She was seen at [facility] with similar complaints on December 31.  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  The ED physician there consulted and the decision was made to start her on Xarelto.  Patient presents today with ongoing pain to the right lower quadrant/right groin.  This is worse when she ambulates or lifts her leg.  She states the pain is so severe that it makes it difficult for her to walk.  She denies any bowel or bladder complaints.  She denies any fevers or chills.  She also complains of severe throat pain stating that when she swallows it feels like "a thousand needles in her throat".  She was seen for this complaint yesterday at [facility].  She had Covid testing performed which was negative and she was discharged to home.  The history is provided by the patient.       History reviewed. No pertinent past medical history.  There are no problems to display for this patient.   History reviewed. No pertinent surgical history.   OB History   No obstetric history on file.     No family history on file.  Social History   Tobacco Use  . Smoking status: Never Smoker  . Smokeless tobacco: Never Used  Vaping Use  . Vaping Use: Never used  Substance Use Topics  . Alcohol use: Yes  . Drug use: No    Home Medications Prior to Admission medications    Medication Sig Start Date End Date Taking? Authorizing Provider  dicyclomine (BENTYL) 20 MG tablet Take 1 tablet (20 mg total) by mouth 4 (four) times daily as needed. 02/07/20   Walisiewicz, Kaitlyn E, PA-C  ondansetron (ZOFRAN ODT) 8 MG disintegrating tablet Take 1 tablet (8 mg total) by mouth every 8 (eight) hours as needed. 02/04/20   Zadie Rhine, MD  cetirizine (ZYRTEC ALLERGY) 10 MG tablet Take 1 tablet (10 mg total) by mouth daily. 09/11/16 01/26/19  Everlene Farrier, PA-C  fluticasone (FLONASE) 50 MCG/ACT nasal spray Place 1 spray into both nostrils daily. 07/31/17 01/26/19  Petrucelli, Pleas Koch, PA-C    Allergies    Patient has no known allergies.  Review of Systems   Review of Systems  All other systems reviewed and are negative.   Physical Exam Updated Vital Signs BP 114/79 (BP Location: Right Arm)   Pulse 94   Temp 98.5 F (36.9 C) (Oral)   Resp 16   Ht 5\' 2"  (1.575 m)   Wt 72.6 kg   LMP 04/24/2020 (Approximate)   SpO2 99%   BMI 29.26 kg/m   Physical Exam Vitals and nursing note reviewed.  Constitutional:      General: She is not in acute distress.    Appearance: She is well-developed and well-nourished. She is not diaphoretic.  HENT:     Head: Normocephalic and atraumatic.  Cardiovascular:  Rate and Rhythm: Normal rate and regular rhythm.     Heart sounds: No murmur heard. No friction rub. No gallop.   Pulmonary:     Effort: Pulmonary effort is normal. No respiratory distress.     Breath sounds: Normal breath sounds. No wheezing.  Abdominal:     General: Bowel sounds are normal. There is no distension.     Palpations: Abdomen is soft.     Tenderness: There is no abdominal tenderness. There is no right CVA tenderness.     Comments: There is tenderness to palpation in the right groin in the area of the hip flexor.  She has minimal tenderness to the right lower quadrant with no peritoneal signs.  Her pain is exacerbated with any movement of her leg.   Musculoskeletal:        General: Normal range of motion.     Cervical back: Normal range of motion and neck supple.     Comments: There is tenderness to palpation in the right groin in the area of the hip flexor.  She has minimal tenderness to the right lower quadrant with no peritoneal signs.  Her pain is exacerbated with any movement of her leg.  Skin:    General: Skin is warm and dry.  Neurological:     Mental Status: She is alert and oriented to person, place, and time.     ED Results / Procedures / Treatments   Labs (all labs ordered are listed, but only abnormal results are displayed) Labs Reviewed - No data to display  EKG None  Radiology No results found.  Procedures Procedures (including critical care time)  Medications Ordered in ED Medications - No data to display  ED Course  I have reviewed the triage vital signs and the nursing notes.  Pertinent labs & imaging results that were available during my care of the patient were reviewed by me and considered in my medical decision making (see chart for details).    MDM Rules/Calculators/A&P  Patient is a 25 year old female presenting with complaints of groin pain and throat pain as described in the HPI.  Patient with 2 prior visits to [facility] in the past several days for the same complaints.  She has undergone CT scan of the abdomen and pelvis, pelvic ultrasound, and DVT study of the right lower leg as well as Covid and strep test.  There was some suggestion of pelvic congestion syndrome and splenic vein thrombosis, however I am not convinced that this is the cause of her discomfort.  The pain in her leg appears very musculoskeletal in nature.  It is worse when I palpate over her hip flexor and when she attempts to raise her leg off the bed.  I highly doubt appendicitis and feels that this has previously been ruled out.  I see no indication to repeat a CT scan or the other imaging studies that were just  done yesterday.  I also see no indication to repeat a strep test or Covid test.  Her throat is very normal in appearance.  There is no tonsillar hypertrophy, exudate, or erythema.  She will be discharged with prednisone, tramadol, and follow-up with her primary doctor if she is not improving.  Final Clinical Impression(s) / ED Diagnoses Final diagnoses:  None    Rx / DC Orders ED Discharge Orders    None       Geoffery Lyons, MD 05/11/20 [phone]

## 2020-05-11 NOTE — ED Triage Notes (Signed)
Patient arrived via POV c/o abdominal pain x 3 days. Seen by UC on new years eve for same. Patient states unable to walk without pain. Patient states pain in RLQ, painful with and without palpation. Patient denies increased pain with urination or bowel movement. Patient is AO x 4, VS WDL, painful gait.

## 2020-05-11 NOTE — ED Notes (Signed)
ED Provider at bedside. 

## 2020-05-11 NOTE — ED Notes (Signed)
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

## 2020-05-11 NOTE — Discharge Instructions (Addendum)
Begin taking prednisone as prescribed.  Begin taking tramadol as prescribed as needed for pain.  Follow-up with your primary doctor in the next few days for a recheck, and return to the ER if symptoms significantly worsen or change.

## 2020-09-18 IMAGING — CT CT RENAL STONE PROTOCOL
2 of 4 series · 17 of 46 positions shown, 19 images · non-contrast
Comparison: None.

CLINICAL DATA: Left flank pain.

EXAM:
CT ABDOMEN AND PELVIS WITHOUT CONTRAST
TECHNIQUE: Multidetector CT imaging of the abdomen and pelvis was performed
following the standard protocol without IV contrast.

[Series 2: axial st · axial · 0.65mm/px · z∈[+701,+1061]mm · 14 of 78 slices shown, 16 images]
[im 3/78  soft-tissue]
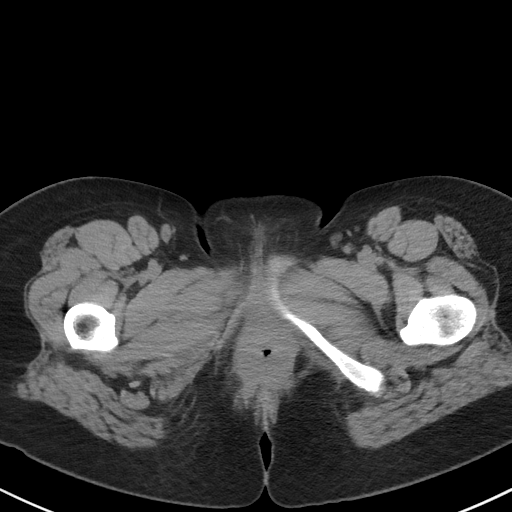
[im 3/78  bone]
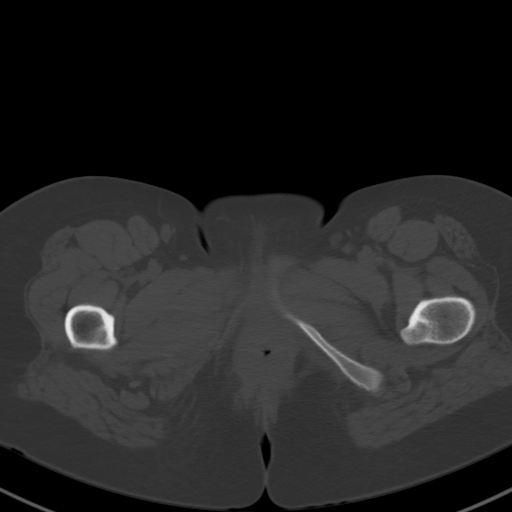
[im 9/78  soft-tissue]
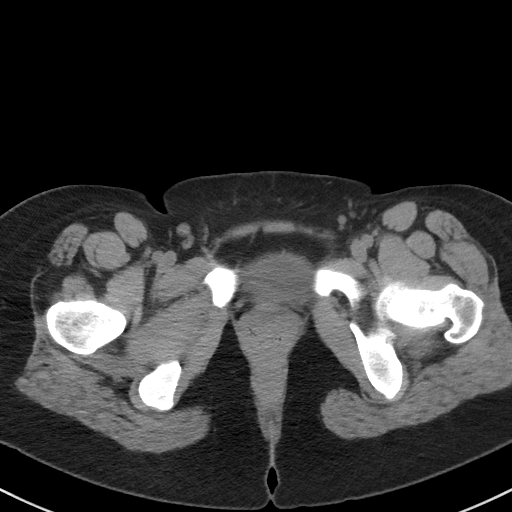
[im 15/78  soft-tissue]
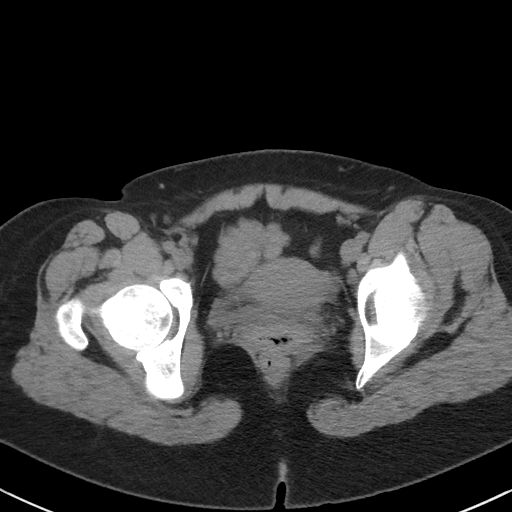
[im 21/78  soft-tissue]
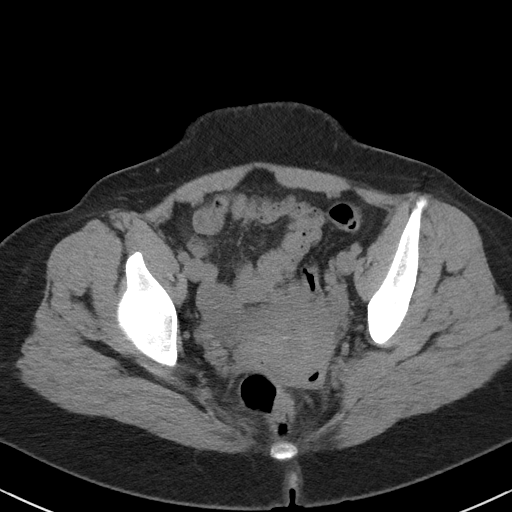
[im 27/78  soft-tissue]
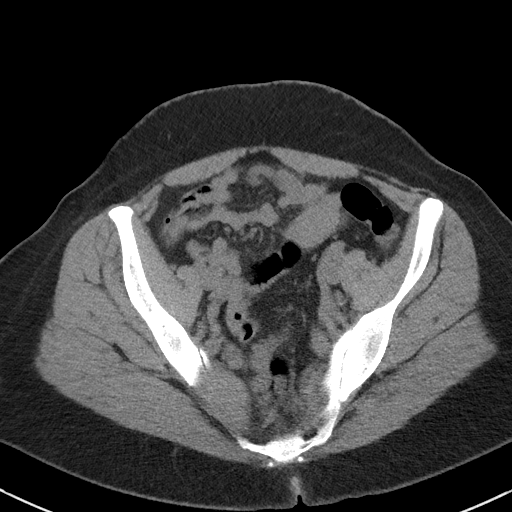
[im 30/78  soft-tissue]
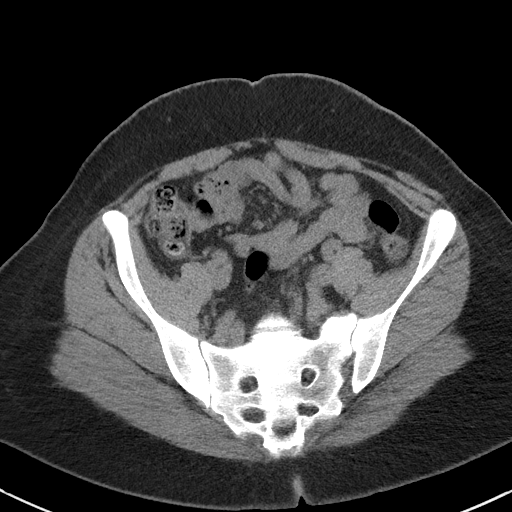
[im 36/78  soft-tissue]
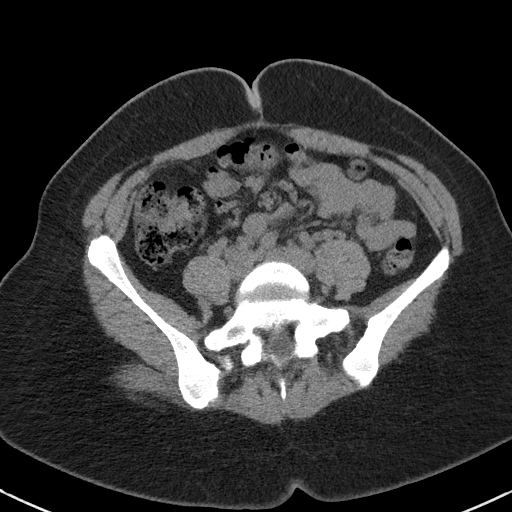
[im 42/78  soft-tissue]
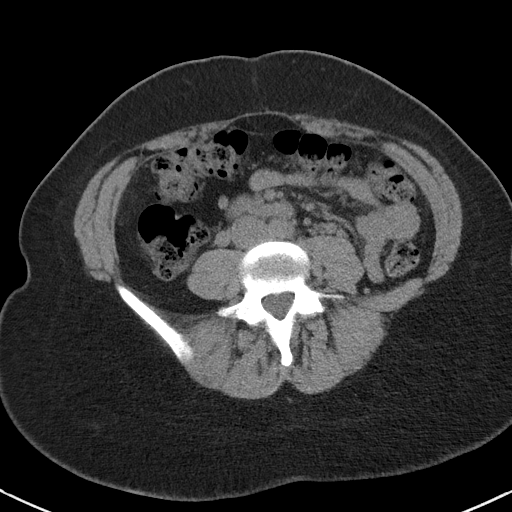
[im 48/78  soft-tissue]
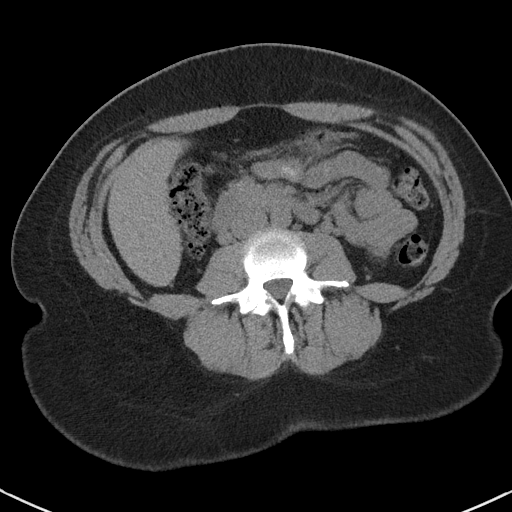
[im 48/78  bone]
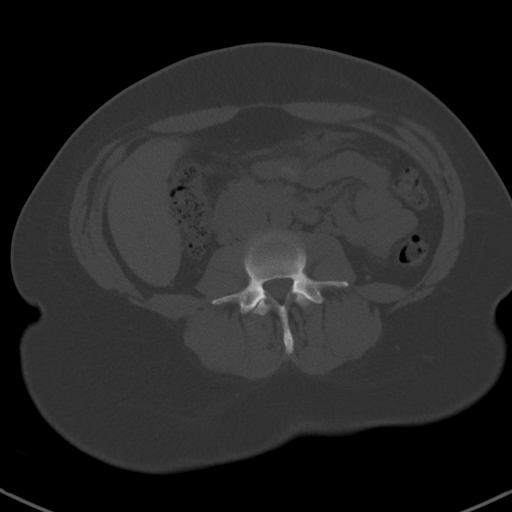
[im 51/78  soft-tissue]
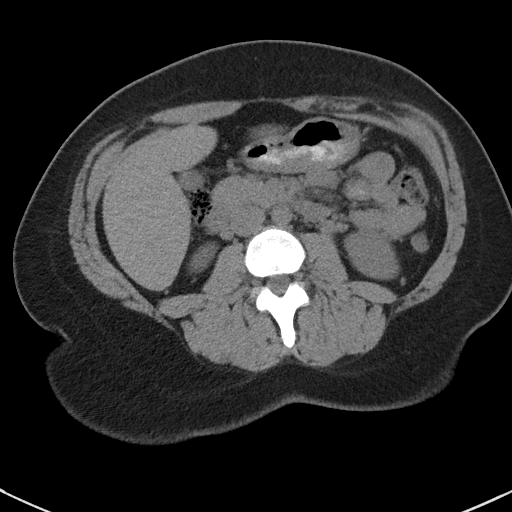
[im 57/78  soft-tissue]
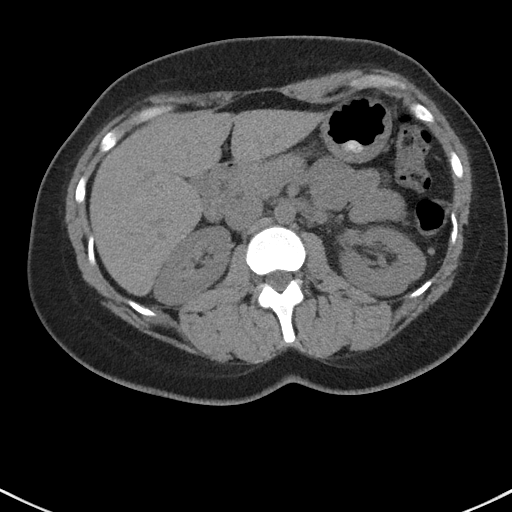
[im 63/78  soft-tissue]
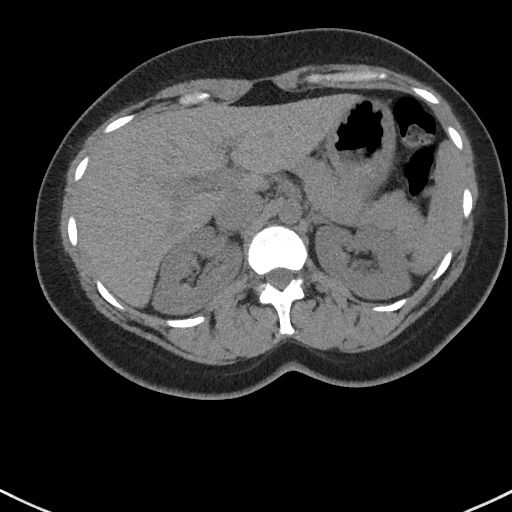
[im 69/78  soft-tissue]
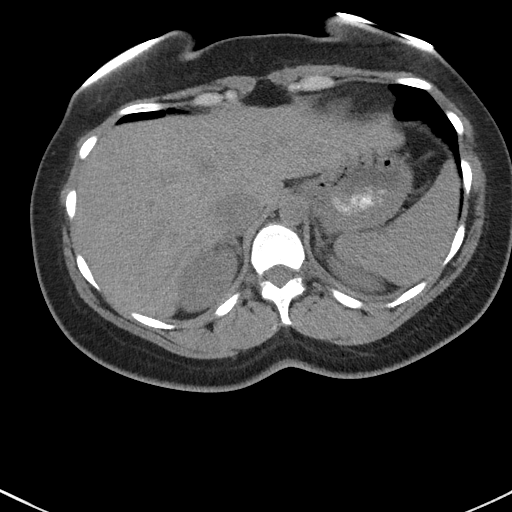
[im 75/78  soft-tissue]
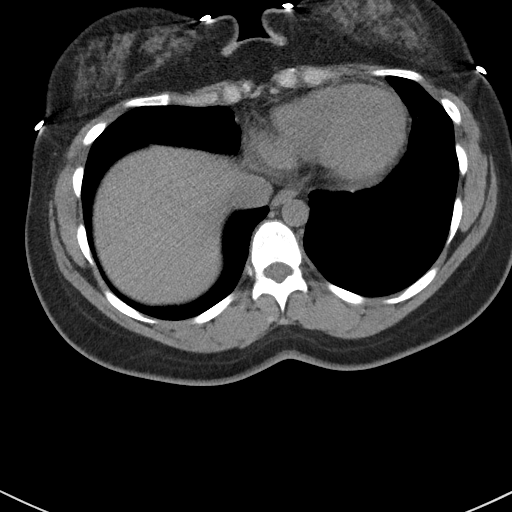

[Series 4: coronal st · coronal · 0.76mm/px · 3 of 90 slices shown]
[im 30/90  soft-tissue]
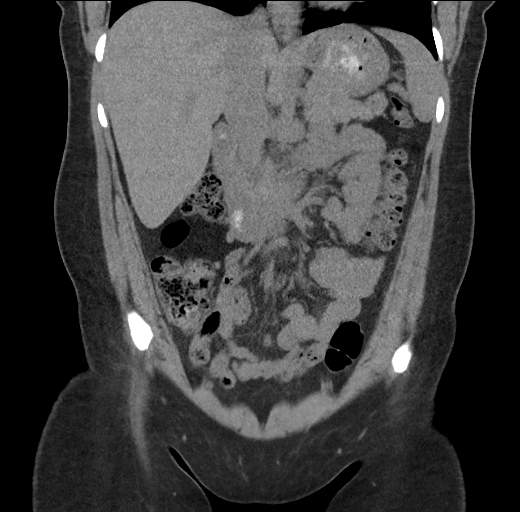
[im 40/90  soft-tissue]
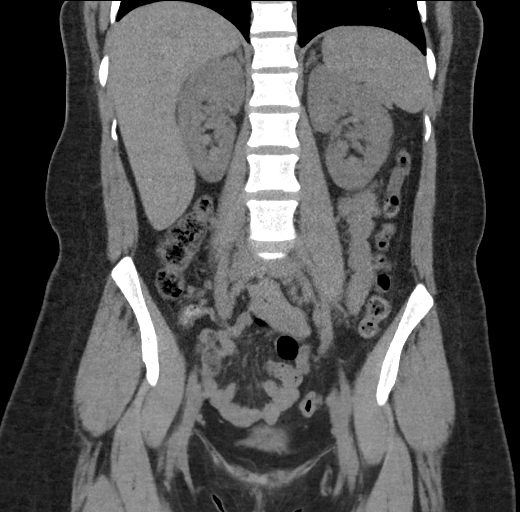
[im 50/90  soft-tissue]
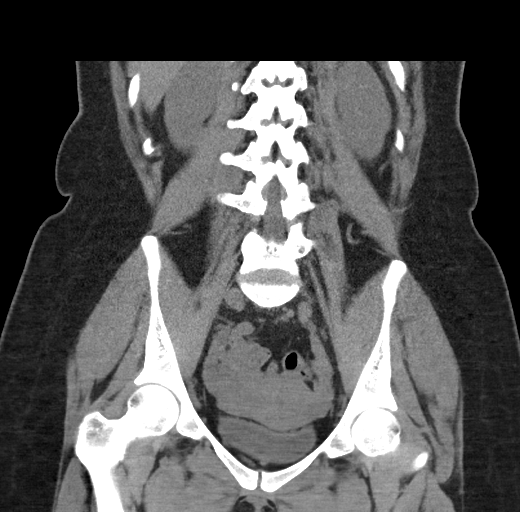

[17 of 46 positions shown; findings below may reference images not displayed]

FINDINGS: Lower chest: Lung bases are clear.

Hepatobiliary: No focal liver abnormality is seen. No gallstones,
gallbladder wall thickening, or biliary dilatation.

Pancreas: No ductal dilatation or inflammation.

Spleen: Normal in size without focal abnormality.

Adrenals/Urinary Tract: Normal adrenal glands. No renal stones or
hydronephrosis. Both ureters are decompressed without ureteral
calculi. No significant perinephric edema. No evidence of focal
renal lesion allowing for lack of IV contrast. Urinary bladder is
minimally distended. No bladder wall thickening. No bladder stone.

Stomach/Bowel: Stomach is unremarkable. No small bowel obstruction
or obvious inflammation. Normal appendix moderate stool burden
throughout the colon. No colonic wall thickening or inflammation.

Vascular/Lymphatic: Abdominal aorta is normal in caliber. No bulky
abdominopelvic adenopathy. Prominence of the ovarian veins, not
well-defined on noncontrast exam.

Reproductive: Uterus is anteverted. No suspicious adnexal mass.

Other: No free air, free fluid, or intra-abdominal fluid collection.

Musculoskeletal: Transitional lumbosacral anatomy. There are no
acute or suspicious osseous abnormalities.
IMPRESSION: No renal stones or obstructive uropathy. No acute abnormality in the
abdomen/pelvis.

## 2021-03-20 IMAGING — CT CT ABD-PELV W/ CM
2 of 4 series · 16 of 46 positions shown, 18 images · IV contrast (omnipaque)
Comparison: August 08, 2019

CLINICAL DATA: Abdominal pain.

EXAM:
CT ABDOMEN AND PELVIS WITH CONTRAST
TECHNIQUE: Multidetector CT imaging of the abdomen and pelvis was performed
using the standard protocol following bolus administration of
intravenous contrast.
CONTRAST:  100mL OMNIPAQUE IOHEXOL 300 MG/ML  SOLN

[Series 2: axial st · axial · 0.77mm/px · z∈[+558,+958]mm · 13 of 88 slices shown, 15 images]
[im 4/88  soft-tissue]
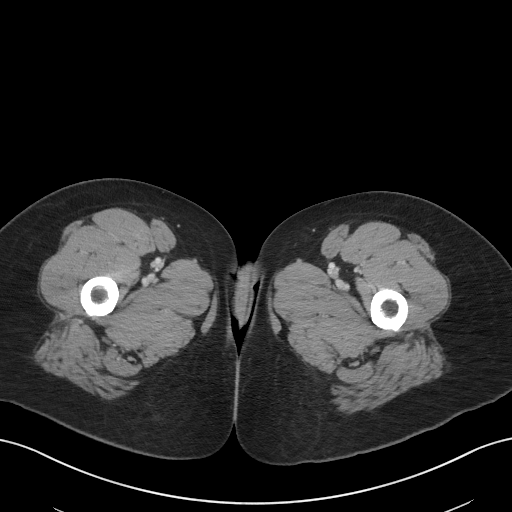
[im 4/88  bone]
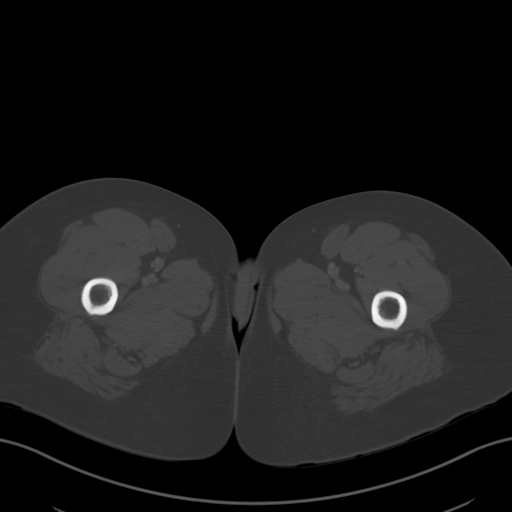
[im 11/88  soft-tissue]
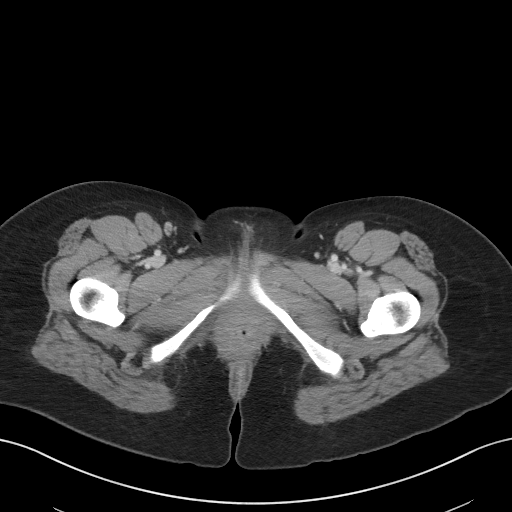
[im 18/88  soft-tissue]
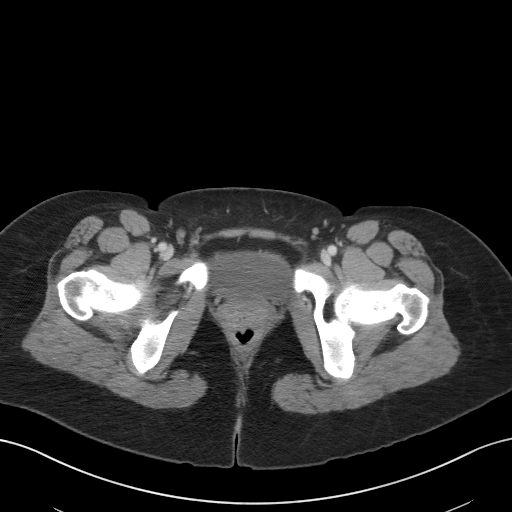
[im 25/88  soft-tissue]
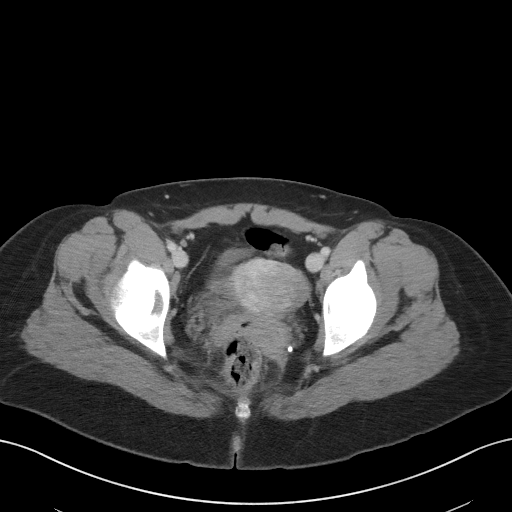
[im 32/88  soft-tissue]
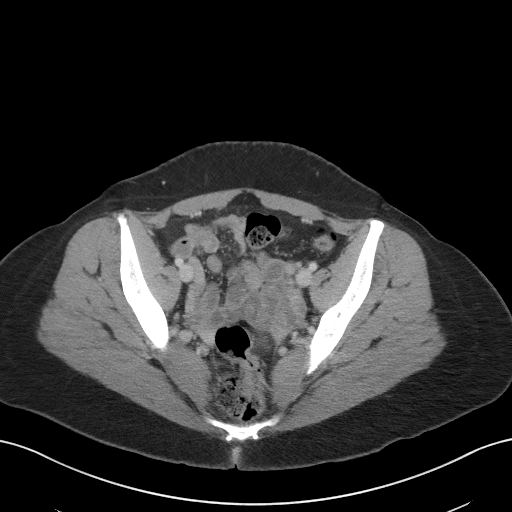
[im 39/88  soft-tissue]
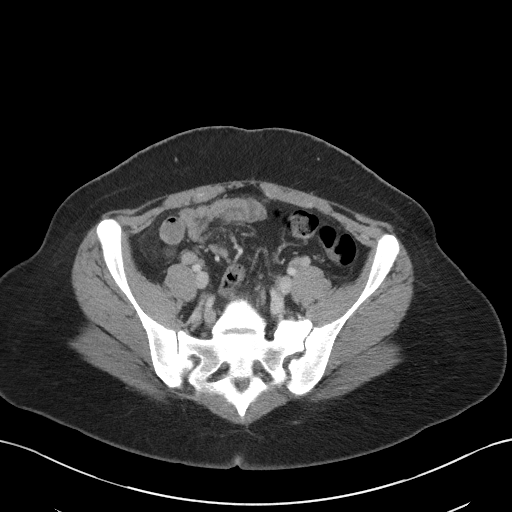
[im 46/88  soft-tissue]
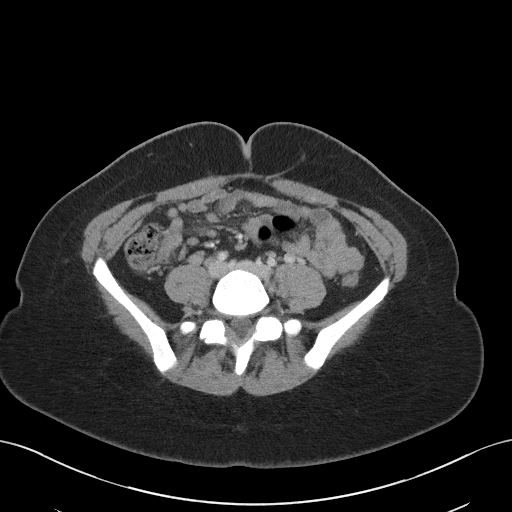
[im 49/88  soft-tissue]
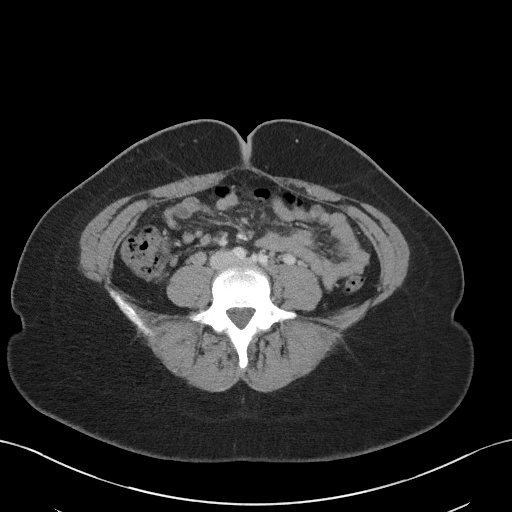
[im 56/88  soft-tissue]
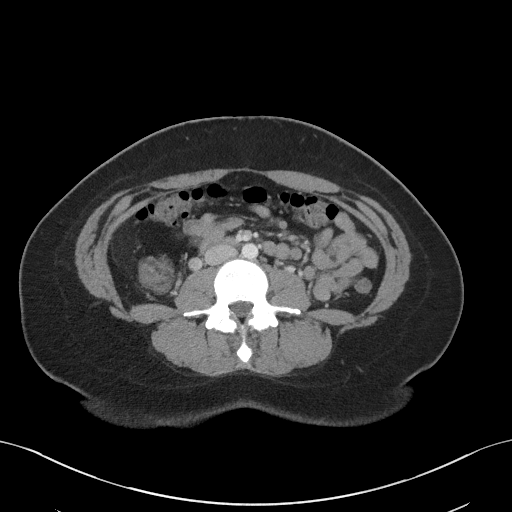
[im 56/88  bone]
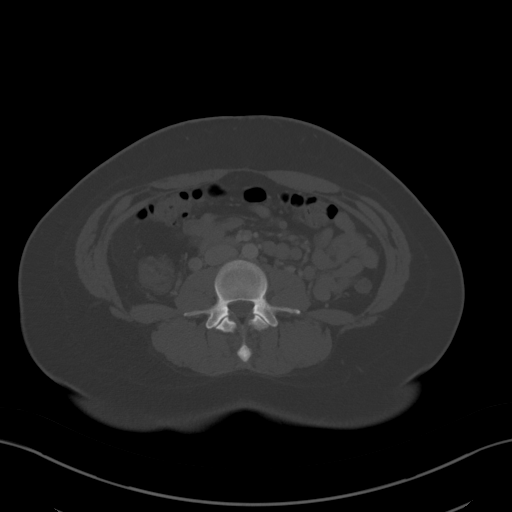
[im 63/88  soft-tissue]
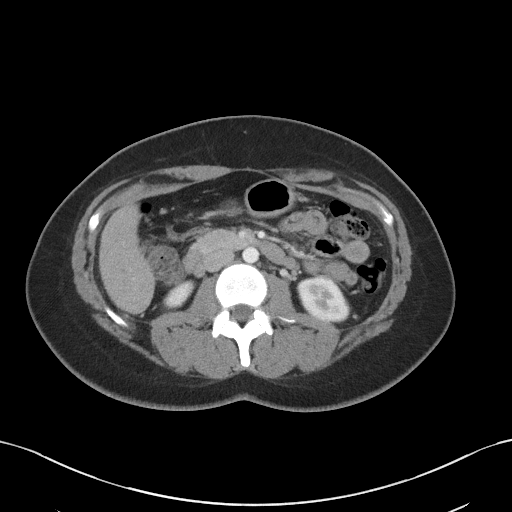
[im 70/88  soft-tissue]
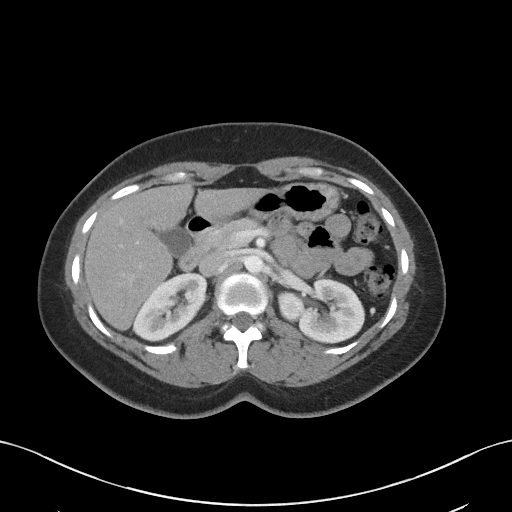
[im 77/88  soft-tissue]
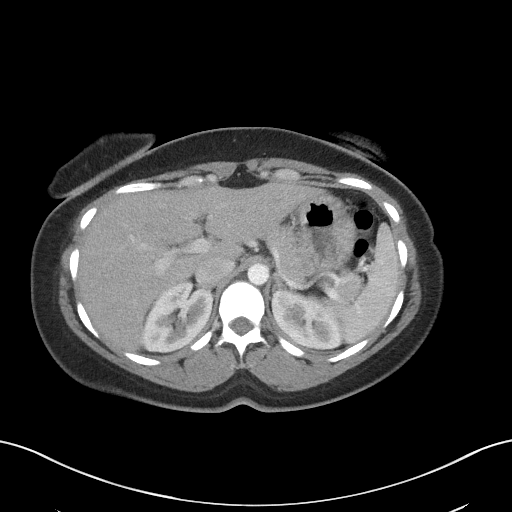
[im 84/88  soft-tissue]
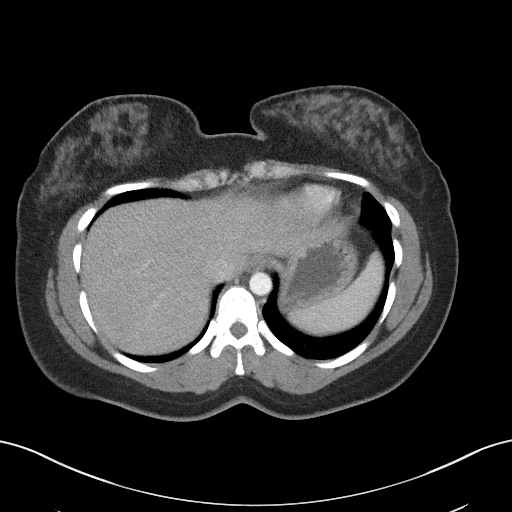

[Series 5: coronal st · coronal · 0.76mm/px · 3 of 76 slices shown]
[im 26/76  soft-tissue]
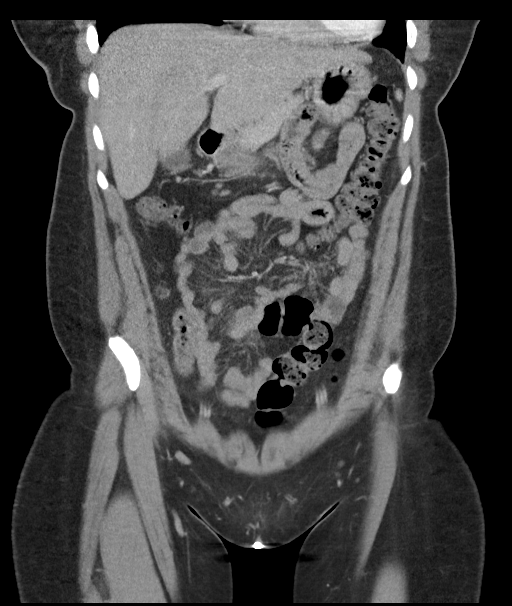
[im 34/76  soft-tissue]
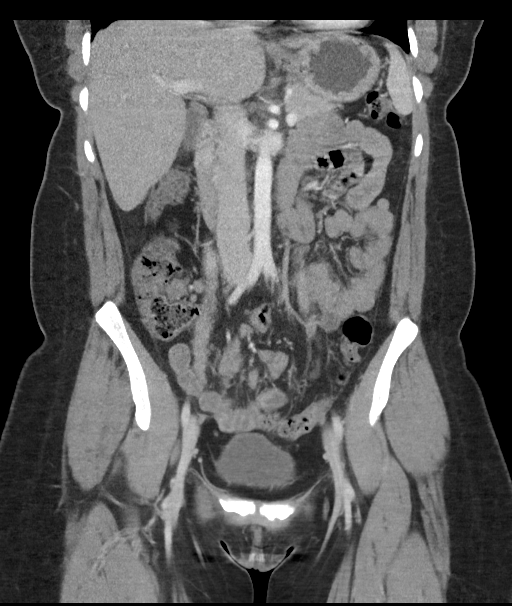
[im 42/76  soft-tissue]
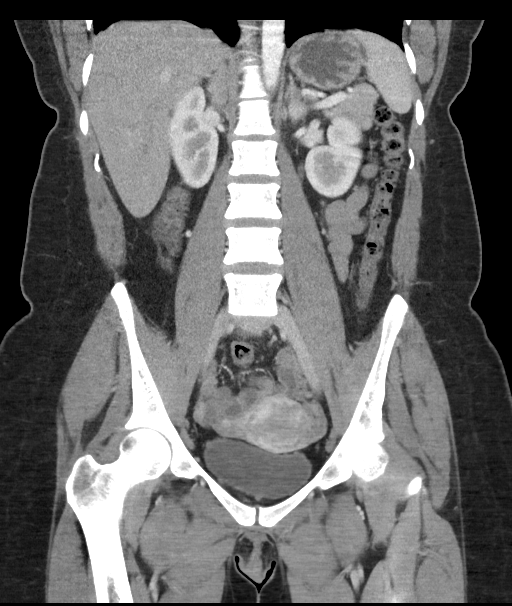

[16 of 46 positions shown; findings below may reference images not displayed]

FINDINGS: Lower chest: No acute abnormality.

Hepatobiliary: No focal liver abnormality is seen. No gallstones,
gallbladder wall thickening, or biliary dilatation.

Pancreas: Unremarkable. No pancreatic ductal dilatation or
surrounding inflammatory changes.

Spleen: Normal in size without focal abnormality.

Adrenals/Urinary Tract: Adrenal glands are unremarkable. Kidneys are
normal, without renal calculi, focal lesion, or hydronephrosis.
Bladder is unremarkable.

Stomach/Bowel: Stomach is within normal limits. Appendix appears
normal. No evidence of bowel dilatation. Mild to moderate severity
thickening of the ascending colon is seen.

Vascular/Lymphatic: No significant vascular findings are present.

Subcentimeter mesenteric lymph nodes are seen within the mid right
abdomen right lower quadrant.

Reproductive: The uterus is normal in size and slightly
heterogeneous in appearance.

Other: No abdominal wall hernia or abnormality. No abdominopelvic
ascites.

Musculoskeletal: No acute or significant osseous findings.
IMPRESSION: Colonic wall thickening which may be secondary to mild to moderate
severity colitis involving the ascending colon.

## 2021-07-01 ENCOUNTER — Encounter (HOSPITAL_BASED_OUTPATIENT_CLINIC_OR_DEPARTMENT_OTHER): Payer: Self-pay

## 2021-07-01 ENCOUNTER — Other Ambulatory Visit: Payer: Self-pay

## 2021-07-01 DIAGNOSIS — R519 Headache, unspecified: Secondary | ICD-10-CM | POA: Diagnosis present

## 2021-07-01 NOTE — ED Triage Notes (Signed)
Pt c/o "knot" to left side of head, pain/swelling x 2 days-denies injury-NAD-steady gait

## 2021-07-02 ENCOUNTER — Emergency Department (HOSPITAL_BASED_OUTPATIENT_CLINIC_OR_DEPARTMENT_OTHER)
Admission: EM | Admit: 2021-07-02 | Discharge: 2021-07-02 | Disposition: A | Discharge status: Home / Self Care | Payer: Medicaid Other | Attending: Emergency Medicine | Admitting: Emergency Medicine

## 2021-07-02 DIAGNOSIS — R519 Headache, unspecified: Secondary | ICD-10-CM

## 2021-07-02 MED ORDER — DOXYCYCLINE HYCLATE 100 MG PO CAPS: 100.0000 mg | ORAL_CAPSULE | Freq: Two times a day (BID) | ORAL | 0 refills | Status: AC

## 2021-07-02 NOTE — Discharge Instructions (Signed)
You are seen in the emergency room today with scalp pain.  I am starting on some antibiotics and have you use a warm compress to help with symptoms.  Please follow close with your primary care doctor.

## 2021-07-02 NOTE — ED Provider Notes (Signed)
° °  Emergency Department Provider Note   I have reviewed the triage vital signs and the nursing notes.   HISTORY  Chief Complaint Mass   HPI Krystal Dunn is a 26 y.o. female presents to the ED with left scalp pain.  Patient noticed a tender area of the left scalp 2 days ago.  She states areas become more tender but not particularly more swollen.  She feels a second area lower in the scalp.  No drainage, bleeding.  No head injury.  No fevers.  No new hair products.   History reviewed. No pertinent past medical history.  Review of Systems  Constitutional: No fever/chills Skin: Positive bumps in the scalp.  Neurological: Negative for headaches (scalp pain).   ____________________________________________   PHYSICAL EXAM:  VITAL SIGNS: ED Triage Vitals  Enc Vitals Group     BP 07/01/21 1921 98/74     Pulse Rate 07/01/21 1921 70     Resp 07/01/21 1921 18     Temp 07/01/21 1921 98.1 F (36.7 C)     Temp Source 07/01/21 1921 Oral     SpO2 07/01/21 1921 100 %     Weight 07/01/21 1923 158 lb (71.7 kg)     Height 07/01/21 1923 5\' 2"  (1.575 m)   Constitutional: Alert and oriented. Well appearing and in no acute distress. Eyes: Conjunctivae are normal.  Head: Atraumatic.  Punctate area of focal swelling and tenderness but no fluctuance in the left parietal scalp.  No surrounding cellulitis or bogginess.  There is an inflamed lymph node in the left occipital region but no abscess or other cellulitis in this area. Ears:  Healthy appearing ear canals and TMs bilaterally Nose: No congestion/rhinnorhea. Mouth/Throat: Mucous membranes are moist.  Neck: No stridor.  Respiratory: Normal respiratory effort.  Gastrointestinal: No distention.  Musculoskeletal: No gross deformities of extremities. Neurologic:  Normal speech and language.  Skin:  Skin is warm and dry. Scalp exam as above.    ____________________________________________   PROCEDURES  Procedure(s) performed:    Procedures  None  ____________________________________________   INITIAL IMPRESSION / ASSESSMENT AND PLAN / ED COURSE  Pertinent labs & imaging results that were available during my care of the patient were reviewed by me and considered in my medical decision making (see chart for details).   Medical Decision Making: Summary:  Patient presents emergency department with scalp pain with tender area to the left parietal scalp.  Seems to be an inflamed hair follicle but no abscess, fungal infection, cellulitis.  Some lymphadenopathy more inferior which the patient is also palpating.  Plan for warm compress, Tylenol/Motrin for pain, doxycycline for potential developing infection and to prevent worsening infection.  Reevaluation with update and discussion with patient at discharge. Discussed PCP follow up plan and ED return precautions.   Disposition: discahrge  ____________________________________________  FINAL CLINICAL IMPRESSION(S) / ED DIAGNOSES  Final diagnoses:  Scalp pain     NEW OUTPATIENT MEDICATIONS STARTED DURING THIS VISIT:  Discharge Medication List as of 07/02/2021 12:21 AM     START taking these medications   Details  doxycycline (VIBRAMYCIN) 100 MG capsule Take 1 capsule (100 mg total) by mouth 2 (two) times daily for 7 days., Starting Sat 07/02/2021, Until Sat 07/09/2021, Normal        Note:  This document was prepared using Dragon voice recognition software and may include unintentional dictation errors.  09/08/2021, MD, [facility] Emergency Medicine    Amiyah Shryock, [facility], MD 07/03/21 [phone]

## 2021-07-02 NOTE — ED Notes (Signed)
Pt A&OX4 ambulatory at d/c with independent steady gait, NAD. Pt verbalized understanding of d/c instructions, prescription and follow up care. For secondary assessment see MDs notes.

## 2022-12-27 NOTE — ED Provider Notes (Signed)
 ------------------------------------------------------------------------------- Attestation signed by Almarie Cross, MD at 12/28/22 [phone] I attest that I am the attending physician for this patient. I have not seen the patient but was available for discussion of the medical decision making with the advanced care practitioner. I have reviewed the treatment plan as described in the provider notes.   -------------------------------------------------------------------------------   [facility]  ED Provider Note  Akeelah Seppala 27 y.o. female DOB: 06-03-1995 MRN: [id number] History   Chief Complaint  Patient presents with  . Rectal Bleeding    Patient says she had a bowel movement and it was bright red blood.    27 year old with pmh of colitis presents with LLQ abdominal pain and bloody bowel movement x 12 hours. She states there was blood in the commode after a bowel movement. She denies straining. No abdominal distention or guarding. No history of hemorrhoids.       History reviewed. No pertinent past medical history.  History reviewed. No pertinent surgical history.  Social History   Substance and Sexual Activity  Alcohol Use Yes  . Alcohol/week: 1.0 standard drink of alcohol  . Types: 1 Cans of beer per week   Social History   Tobacco Use  Smoking Status Never  Smokeless Tobacco Never   E-Cigarettes  . Vaping Use Current Every Day User   . Start Date    . Cartridges/Day    . Quit Date     Social History   Substance and Sexual Activity  Drug Use Never         No Known Allergies  Discharge Medication List as of 12/27/2022 10:30 PM      Review of Systems   Review of Systems  Constitutional:  Negative for activity change, appetite change, chills, fatigue and fever.  HENT:  Negative for congestion.   Respiratory:  Negative for cough, choking and chest tightness.   Gastrointestinal:  Positive for blood in stool.  Negative for abdominal distention, constipation, diarrhea and nausea.  Endocrine: Negative for polydipsia, polyphagia and polyuria.  Genitourinary:  Negative for flank pain, frequency, hematuria and menstrual problem.  Musculoskeletal:  Negative for arthralgias and gait problem.  Skin:  Negative for pallor, rash and wound.  Psychiatric/Behavioral:  Negative for agitation, behavioral problems and confusion.     Physical Exam   ED Triage Vitals [12/27/22 1943]  BP 126/84  Heart Rate 71  Resp 18  SpO2 100 %  Temp 98.3 F (36.8 C)    Physical Exam  Constitutional: She appears well-developed and well-nourished. She does not appear distressed, does not appear ill and no respiratory distress.  HENT:  Head: Normocephalic.  Mouth/Throat: Voice normal.  Eyes: Pupils are equal, round, and reactive to light.  Neck: Normal range of motion and voice normal.  Cardiovascular: Normal rate, regular rhythm and normal heart sounds.  Pulmonary/Chest: No respiratory distress. Respiratory effort normal and breath sounds normal.  Abdominal: Soft. There is no abdominal tenderness. There is no guarding and no rebound. Abdomen not distended. Bowel sounds are normal. There is no CVA tenderness.  Musculoskeletal: No visible trauma noted on back exam.     Cervical back: Normal range of motion.   Neurological: She is alert. Moves all extremities equally. She has normal speech.  Skin: Skin is warm. Skin is dry.  Psychiatric: She has a normal mood and affect. Her behavior is normal.    ED Course   Lab results:   CBC AND DIFFERENTIAL - Normal  Result Value   WBC 5.7     RBC 4.19     HGB 12.6     HCT 38.0     MCV 90.7     MCH 30.1     MCHC 33.2     Plt Ct 224     RDW SD 41.2     MPV 10.8     NRBC% 0.0     Absolute NRBC Count 0.00    LIPASE - Normal   Lipase 30    HCG, URINE, QUALITATIVE - Normal   U BETA HCG QUAL Negative    COMPREHENSIVE METABOLIC PANEL   Na 139     Potassium 3.7      Cl 103     CO2 26     AGAP 10     Glucose 99     BUN 13     Creatinine 0.93     Ca 9.6     ALK PHOS 81     T Bili 0.33     Total Protein 7.7     Alb 4.2     GLOBULIN 3.5     ALBUMIN/GLOBULIN RATIO 1.2     BUN/CREAT RATIO 14.0     ALT 18     AST 22     eGFR 86     Comment: Normal GFR (glomerular filtration rate) > 60 mL/min/1.73 meters squared, < 60 may include impaired kidney function. Calculation based on the Chronic Kidney Disease Epidemiology Collaboration (CK-EPI)equation refit without adjustment for race.  MANUAL DIFFERENTIAL   Segmented Neutrophil % Man 44     Lymphocyte % Manual 48     Monocyte % Manual 7     Band Neutrophil % Manual 1     Absolute Neutrophil Count, Manual 2.57     Absolute Lymphocyte Count, Manual 2.74     Absolute Monocyte Count, Manual 0.40     Absolute Band Neutrophil Count Manual 0.06      Imaging:   CT ABDOMEN PELVIS W IV CONTRAST   Narrative:    CT ABDOMEN AND PELVIS WITH INTRAVENOUS CONTRAST: 12/27/2022 10:16 PM  HISTORY: 27 years  old Female with Abdominal Pain.  COMPARISON: None available  TECHNIQUE: Axial contiguous images were obtained from the lung bases to the proximal femurs with intravenous intravenous contrast administered. Sagittal and coronal reconstructions were also obtained and reviewed. This exam was performed according to our departmental dose-optimization program, which includes automated exposure control, adjustment of the mA and/or KV according to the patient's size and/or use of iterative reconstruction technique.  CONTRAST: 80 mL  Isovue-370,   FINDINGS:    LUNG BASES: No focal consolidative airspace opacities are seen.  No discrete pleural effusions are seen.   LIVER: The visualized hepatic parenchyma demonstrates no focal abnormality.  The main portal vein is well opacified with contrast.  GALLBLADDER: The gallbladder demonstrates no evidence of calcified gallstones.   SPLEEN: The spleen is normal in  size.  PANCREAS: The pancreas is unremarkable.  ADRENAL GLANDS: The bilateral adrenal glands are unremarkable.  KIDNEYS: Both kidneys demonstrate no hydronephrosis. No renal or ureteral calculi are seen.  PELVIS: The urinary bladder is mildly distended. The uterus is not visualized and is likely surgically absent.  STOMACH: The stomach is mildly distended.  BOWEL: The small bowel loops appear unremarkable. No pericolonic inflammatory stranding is seen.  APPENDIX: The appendix is not visualized. No inflammatory stranding is seen at the right lower quadrant of the abdomen.  PERITONEUM: There is  no evidence of pneumoperitoneum. Trace free fluid is seen.  VESSELS: The IVC is unremarkable. The abdominal aorta is within normal limits of size.  LYMPH NODES: No significantly enlarged lymph nodes are seen in the abdomen or pelvis.  BONES AND SOFT TISSUES: No acute osseous abnormality is seen.    Impression:    IMPRESSION: No acute process is seen within the abdomen or pelvis.   Electronically Signed by: Rock Croft, MD on 12/27/2022 10:18 PM    ECG: ECG Results   None    Pre-Sedation Procedures    Medical Decision Making Report of blood in stool. Negative physical exam. No anemia. No tachycardia or hypotension.   Problems Addressed: Lower GI bleeding: self-limited or minor problem    Details: Unclear whether there was truly blood loss. No anemia. No blood loss in the ED. Follow up with [facility]. Rx for PPI   Amount and/or Complexity of Data Reviewed Labs: ordered. Radiology: ordered.  Risk OTC drugs. Prescription drug management.        Provider Communication  Discharge Medication List as of 12/27/2022 10:30 PM     START taking these medications   Details  pantoprazole sodium (PROTONIX) 20 mg tablet Take one tablet (20 mg dose) by mouth daily., Starting Wed 12/27/2022, Normal        Discharge Medication List as of 12/27/2022 10:30 PM       Discharge Medication List as of 12/27/2022 10:30 PM      Clinical Impression Final diagnoses:  Lower GI bleeding    ED Disposition     ED Disposition  Discharge   Condition  Stable   Comment  --                 Follow-up Information     Schedule an appointment as soon as possible for a visit  with [facility]   Contact information: [address] [place]  [address] [phone]                 Electronically signed by:    Reyes Rumpf, FNP 12/28/22 0040

## 2023-08-16 NOTE — ED Triage Notes (Signed)
 Pt c/o palpitations for a couple days . Pt also has pressure in her upper chest. Denies n/v

## 2023-08-16 NOTE — ED Provider Notes (Signed)
 Patient placed in First Look pathway, seen and evaluated for chief complaint of mediastinal chest pressure that comes and goes. Patient reports feeling lightheaded when chest pressure intensifies. Reports some associated SHOB.  Pertinent exam findings include no obvious distress.   Based on initial evaluation, labs are currently indicated and radiology studies are currently indicated as allowed for current processes and treatments as applicable in a triage setting. Patient counseled on process, plan, and necessity for staying for completing the evaluation.    Note By: Toribio Clam, PA-C 5:01 PM   Emergency Department Provider Note   History   Chief Complaint  Patient presents with  . Palpitations      History provided by:  Patient Palpitations Palpitations quality:  Unable to specify Onset quality:  Gradual Duration:  2 days Timing:  Intermittent Progression:  Waxing and waning Chronicity:  New Relieved by:  None tried Worsened by:  Nothing Ineffective treatments:  None tried Associated symptoms: chest pain      Past Medical History Past Medical History:  Diagnosis Date  . Anemia   . Anxiety   . Miscarriage (CMD)   . Rh incompatibility      Past Surgical History Past Surgical History:  Procedure Laterality Date  . DILATION AND CURETTAGE OF UTERUS     Procedure: DILATION AND CURETTAGE OF UTERUS  . DILATION AND EVACUATION N/A 01/27/2017   Procedure: DILATATION & EVACUATION (D & E);  Surgeon: Kalpen Navin Patel, MD;  Location: [facility] MAIN OR;  Service: Gynecology;  Laterality: N/A;      Medications Current Outpatient Medications  Medication Instructions  . metroNIDAZOLE  (FLAGYL ) 500 mg, oral, 3 times daily  . norgestimate-ethinyl estradioL (Sprintec, 28,) 0.25-35 mg-mcg tab tablet 1 tablet, oral, Every 24 hours  . semaglutide, weight loss, (Wegovy) 0.25 mg/0.5 mL subcutaneous pen injector Inject the contents of one syringe (0.25 mg total) under the skin every 7 days.   . semaglutide, weight loss, (Wegovy) 0.5 mg/0.5 mL subcutaneous pen injector Inject the contents of 1 syringe (0.5mg ) under the skin weekly.  . semaglutide, weight loss, (Wegovy) 1 mg/0.5 mL subcutaneous pen injector Inject the contents of 1 syringe (1 mg total) under the skin once a week.  . semaglutide, weight loss, (Wegovy) 1.7 mg/0.75 mL subcutaneous pen injector Inject the contents of one syringe under the skin every 7 days.      Allergies No Known Allergies   Family History Family History  Problem Relation Name Age of Onset  . Asthma Mother    . Heart disease Father    . Cancer Neg Hx    . Diabetes Neg Hx    . Hypertension Neg Hx    . Breast cancer Neg Hx       Social History Social History   Socioeconomic History  . Marital status: Single    Spouse name: Not on file  . Number of children: Not on file  . Years of education: Not on file  . Highest education level: Not on file  Occupational History  . Not on file  Tobacco Use  . Smoking status: Former  . Smokeless tobacco: Never  Substance and Sexual Activity  . Alcohol use: Yes  . Drug use: No  . Sexual activity: Not on file  Other Topics Concern  . Not on file  Social History Narrative  . Not on file   Social Drivers of Health   Food Insecurity: Not on file  Transportation Needs: Not on file  Safety: Not on file  Living Situation: Not on file      Review of Systems  Review of Systems  Cardiovascular:  Positive for chest pain and palpitations.     Physical Exam   ED Triage Vitals [08/16/23 1649]  Temp 98 F (36.7 C)  Heart Rate 73  Resp 18  BP 128/89  MAP (mmHg) 109  SpO2 100 %  O2 Device None (Room air)  O2 Flow Rate (L/min)   Weight 78 kg (172 lb)    Physical Exam Constitutional:      General: She is not in acute distress.    Appearance: Normal appearance.  HENT:     Head: Atraumatic.     Mouth/Throat:     Mouth: Mucous membranes are moist.  Eyes:     Extraocular Movements:  Extraocular movements intact.  Cardiovascular:     Rate and Rhythm: Normal rate and regular rhythm.     Pulses: Normal pulses.     Heart sounds: No murmur heard. Pulmonary:     Effort: Pulmonary effort is normal. No respiratory distress.     Breath sounds: No stridor. No wheezing, rhonchi or rales.  Abdominal:     General: Abdomen is flat. There is no distension.  Musculoskeletal:        General: No deformity.     Cervical back: Normal range of motion.     Right lower leg: No edema.     Left lower leg: No edema.  Skin:    General: Skin is warm and dry.  Neurological:     Mental Status: She is alert and oriented to person, place, and time. Mental status is at baseline.  Psychiatric:        Mood and Affect: Mood normal.     Labs   Abnormal Labs Reviewed  URINALYSIS WITH REFLEX TO MICROSCOPIC - Abnormal; Notable for the following components:      Result Value   Specific Gravity, Urine 1.030 (*)    All other components within normal limits   Narrative:    Microscopic not indicated    Labs independently reviewed by myself and considered in medical decision making.  Radiology   XR Chest 2 Views  Final Result by Delmar Herbert Winfred Booker Results In [place] [id number] (04/10 2026)  CLINICAL DATA:  Chest pain. Left shoulder pain and lower extremity  swelling.    EXAM:  CHEST - 2 VIEW    COMPARISON:  None Available.    FINDINGS:  The heart size and mediastinal contours are within normal limits.  Both lungs are clear. No acute osseous abnormality.    IMPRESSION:  No active cardiopulmonary disease.      Electronically Signed    By: Leita Birmingham M.D.    On: 08/16/2023 20:26        Imaging independently reviewed by myself and considered in medical decision making. Imaging final read interpreted by radiology.  Procedure Note  Procedures  Medical Decision Making   Medical Decision Making Patient is a very pleasant 28 year old female with history as above presents for evaluation  of palpitations.  She has been having intermittent palpitations for the last 2 days.  She endorses mild chest pressure but denies any frank chest pain at this time.  She reports lightheadedness during these episodes.  She had mild shortness of breath but no ongoing shortness of breath at this time.  On exam, patient overall appears well.  Vital signs are reassuring.  EKG shows sinus rhythm with no evidence of acute ischemia or  dysrhythmia.  Differential diagnosis includes dysrhythmia, anxiety, less likely ACS, PE.  She is PERC negative.  Labs were obtained including troponin, electrolytes.  Her labs are reassuring.  I reviewed the telemetry in the ED and there was 1 episode of sustained bigeminy.  She was symptomatic during this time.  I suspect that is the etiology of her symptoms.  She will likely benefit from Holter monitor and cardiology follow-up.  She appears stable for discharge at this time.  Return precautions discussed in detail.  Problems Addressed: Palpitations: complicated acute illness or injury  Amount and/or Complexity of Data Reviewed Labs: ordered. Radiology: ordered. ECG/medicine tests: ordered.     ED Clinical Impression   1. Palpitations      ED Disposition     ED Disposition  Discharge   Condition  Stable   Comment  --           Discharge Medication List as of 08/16/2023 10:55 PM        FOLLOW UP Hildegard Rinks, MD [address] [place] [address] [phone]  Schedule an appointment as soon as possible for a visit    [facility] -  EMERGENCY DEPARTMENT [address] [place]  [address] [phone]  As needed, If symptoms worsen

## 2023-12-04 ENCOUNTER — Emergency Department (HOSPITAL_BASED_OUTPATIENT_CLINIC_OR_DEPARTMENT_OTHER): Admission: EM | Admit: 2023-12-04 | Discharge: 2023-12-04 | Disposition: A | Discharge status: Home / Self Care

## 2023-12-04 ENCOUNTER — Emergency Department (HOSPITAL_BASED_OUTPATIENT_CLINIC_OR_DEPARTMENT_OTHER)

## 2023-12-04 ENCOUNTER — Encounter (HOSPITAL_BASED_OUTPATIENT_CLINIC_OR_DEPARTMENT_OTHER): Payer: Self-pay

## 2023-12-04 ENCOUNTER — Other Ambulatory Visit: Payer: Self-pay

## 2023-12-04 DIAGNOSIS — R110 Nausea: Secondary | ICD-10-CM | POA: Diagnosis not present

## 2023-12-04 DIAGNOSIS — R197 Diarrhea, unspecified: Secondary | ICD-10-CM | POA: Diagnosis not present

## 2023-12-04 DIAGNOSIS — R109 Unspecified abdominal pain: Secondary | ICD-10-CM | POA: Diagnosis present

## 2023-12-04 DIAGNOSIS — R1084 Generalized abdominal pain: Secondary | ICD-10-CM | POA: Diagnosis not present

## 2023-12-04 LAB — COMPREHENSIVE METABOLIC PANEL WITH GFR
ALT: 12 U/L (ref 0–44)
AST: 17 U/L (ref 15–41)
Albumin: 4.4 g/dL (ref 3.5–5.0)
Alkaline Phosphatase: 71 U/L (ref 38–126)
Anion gap: 12 (ref 5–15)
BUN: 13 mg/dL (ref 6–20)
CO2: 22 mmol/L (ref 22–32)
Calcium: 9.5 mg/dL (ref 8.9–10.3)
Chloride: 101 mmol/L (ref 98–111)
Creatinine, Ser: 0.64 mg/dL (ref 0.44–1.00)
GFR, Estimated: >60 mL/min (ref >60)
Glucose, Bld: 80 mg/dL (ref 70–99)
Potassium: 3.8 mmol/L (ref 3.5–5.1)
Sodium: 135 mmol/L (ref 135–145)
Total Bilirubin: 0.6 mg/dL (ref 0.0–1.2)
Total Protein: 7.7 g/dL (ref 6.5–8.1)

## 2023-12-04 LAB — URINALYSIS, ROUTINE W REFLEX MICROSCOPIC
Bilirubin Urine: NEGATIVE
Glucose, UA: NEGATIVE mg/dL
Hgb urine dipstick: NEGATIVE
Ketones, ur: 40 mg/dL — AB
Nitrite: NEGATIVE
Protein, ur: NEGATIVE mg/dL
Specific Gravity, Urine: >=1.03 (ref 1.005–1.030)
pH: 5.5 (ref 5.0–8.0)

## 2023-12-04 LAB — CBC WITH DIFFERENTIAL/PLATELET
Abs Immature Granulocytes: 0.01 K/uL (ref 0.00–0.07)
Basophils Absolute: 0 K/uL (ref 0.0–0.1)
Basophils Relative: 0 %
Eosinophils Absolute: 0.1 K/uL (ref 0.0–0.5)
Eosinophils Relative: 1 %
HCT: 37 % (ref 36.0–46.0)
Hemoglobin: 12.5 g/dL (ref 12.0–15.0)
Immature Granulocytes: 0 %
Lymphocytes Relative: 32 %
Lymphs Abs: 2.2 K/uL (ref 0.7–4.0)
MCH: 30 pg (ref 26.0–34.0)
MCHC: 33.8 g/dL (ref 30.0–36.0)
MCV: 88.7 fL (ref 80.0–100.0)
Monocytes Absolute: 0.6 K/uL (ref 0.1–1.0)
Monocytes Relative: 9 %
Neutro Abs: 3.9 K/uL (ref 1.7–7.7)
Neutrophils Relative %: 58 %
Platelets: 246 K/uL (ref 150–400)
RBC: 4.17 MIL/uL (ref 3.87–5.11)
RDW: 12.5 % (ref 11.5–15.5)
WBC: 6.8 K/uL (ref 4.0–10.5)
nRBC: 0 % (ref 0.0–0.2)

## 2023-12-04 LAB — URINALYSIS, MICROSCOPIC (REFLEX): RBC / HPF: NONE SEEN RBC/hpf (ref 0–5)

## 2023-12-04 LAB — HCG, SERUM, QUALITATIVE: Preg, Serum: NEGATIVE

## 2023-12-04 LAB — LIPASE, BLOOD: Lipase: 43 U/L (ref 11–51)

## 2023-12-04 MED ORDER — ONDANSETRON 4 MG PO TBDP: 4.0000 mg | ORAL_TABLET | Freq: Three times a day (TID) | ORAL | 0 refills | Status: AC | PRN

## 2023-12-04 MED ORDER — KETOROLAC TROMETHAMINE 15 MG/ML IJ SOLN
15.0000 mg | Freq: Once | INTRAMUSCULAR | Status: AC
  Administered 2023-12-04: 15 mg via INTRAVENOUS
  Filled 2023-12-04: qty 1

## 2023-12-04 MED ORDER — MORPHINE SULFATE (PF) 2 MG/ML IV SOLN
2.0000 mg | Freq: Once | INTRAVENOUS | Status: AC
  Administered 2023-12-04: 2 mg via INTRAVENOUS
  Filled 2023-12-04: qty 1

## 2023-12-04 MED ORDER — IOHEXOL 300 MG/ML  SOLN
100.0000 mL | Freq: Once | INTRAMUSCULAR | Status: AC | PRN
  Administered 2023-12-04: 100 mL via INTRAVENOUS

## 2023-12-04 MED ORDER — ONDANSETRON HCL 4 MG/2ML IJ SOLN
4.0000 mg | Freq: Once | INTRAMUSCULAR | Status: AC
  Administered 2023-12-04: 4 mg via INTRAVENOUS
  Filled 2023-12-04: qty 2

## 2023-12-04 MED ORDER — SODIUM CHLORIDE 0.9 % IV BOLUS
1000.0000 mL | Freq: Once | INTRAVENOUS | Status: AC
  Administered 2023-12-04: 1000 mL via INTRAVENOUS

## 2023-12-04 NOTE — ED Provider Notes (Signed)
  Physical Exam  BP 118/72   Pulse 80   Temp 98.4 F (36.9 C) (Oral)   Resp 18   SpO2 100%   Physical Exam  Procedures  Procedures  ED Course / MDM   Clinical Course as of 12/04/23 1606  Tue Dec 04, 2023  1501 Assumed care from Dr Simon. 28 yo F who pw acute on chronic abdominal pain worsening for last 3 days. Appears to be very cyclical in nature. TTP in lower abdomen. No STI or UTI symptoms. Did have diarrhea this morning. Awaiting CT scan and repeat evaluation.  [RP]  1605 Patient reevaluated.  Her CT scan did not show acute findings.  She tells me that her abdominal pain will migrate.  Currently its worst in her epigastrium.  Abdomen is soft and has mild tenderness in her epigastrium.  Unclear exactly what is causing her acute on chronic abdominal pain but suspect possible IBS.  Will have her follow-up with her primary doctor and GI.  Given a prescription of Zofran  to take for her nausea. [RP]    Clinical Course User Index [RP] Yolande Lamar BROCKS, MD   Medical Decision Making Amount and/or Complexity of Data Reviewed Labs: ordered. Radiology: ordered.  Risk Prescription drug management.      Yolande Lamar BROCKS, MD 12/04/23 [phone]

## 2023-12-04 NOTE — ED Notes (Signed)
 To CT

## 2023-12-04 NOTE — ED Provider Notes (Addendum)
 [facility] Provider Note   CSN: [id number] Arrival date & time: 12/04/23  1351     Patient presents with: Abdominal Pain   Krystal Dunn is a 28 y.o. female.   HPI     Patient presents due to abdominal pain, nausea.  Has been having abdominal pain over the past couple days but seems to be increasing in nature.  Patient states that she will have random episodes of abdominal pain once or twice a week.  No predisposing factors that she is aware of.  No obvious food group that seems to make it worse.  She states that is not associated with any kind of menstrual cycle.  Patient states that today the abdominal pain got worse.  Had episodes of diarrhea this morning as well.  She maybe thought there might be a red tinge to the diarrhea but she is not really sure.  No dark blood.  No melena.  Endorses nausea but no vomiting.  No fever no chills.  Denies any dysuria.  No vaginal discharge.  States that maybe the past has been diagnosed with colitis.  No history of IBD or IBS diagnosis.  Previous medical history reviewed : Patient was last seen in the ED in April 2025.  Was seen because of palpitations.  Reassuring lab work.   Prior to Admission medications   Medication Sig Start Date End Date Taking? Authorizing Provider  dicyclomine  (BENTYL ) 20 MG tablet Take 1 tablet (20 mg total) by mouth 4 (four) times daily as needed. 02/07/20   Walisiewicz, Kaitlyn E, PA-C  ondansetron  (ZOFRAN  ODT) 8 MG disintegrating tablet Take 1 tablet (8 mg total) by mouth every 8 (eight) hours as needed. 02/04/20   Midge Golas, MD  predniSONE  (DELTASONE ) 10 MG tablet Take 2 tablets (20 mg total) by mouth 2 (two) times daily. 05/11/20   Geroldine Berg, MD  traMADol  (ULTRAM ) 50 MG tablet Take 1 tablet (50 mg total) by mouth every 6 (six) hours as needed. 05/11/20   Geroldine Berg, MD  cetirizine  (ZYRTEC  ALLERGY) 10 MG tablet Take 1 tablet (10 mg total) by mouth daily. 09/11/16  01/26/19  Dansie, William, PA-C  fluticasone  (FLONASE ) 50 MCG/ACT nasal spray Place 1 spray into both nostrils daily. 07/31/17 01/26/19  Petrucelli, Samantha R, PA-C    Allergies: Patient has no known allergies.    Review of Systems  Updated Vital Signs BP 118/72   Pulse 80   Temp 98.4 F (36.9 C) (Oral)   Resp 18   SpO2 100%   Physical Exam  (all labs ordered are listed, but only abnormal results are displayed) Labs Reviewed  CBC WITH DIFFERENTIAL/PLATELET  COMPREHENSIVE METABOLIC PANEL WITH GFR  LIPASE, BLOOD  HCG, SERUM, QUALITATIVE  URINALYSIS, ROUTINE W REFLEX MICROSCOPIC    EKG: None  Radiology: No results found.   Procedures   Medications Ordered in the ED  sodium chloride  0.9 % bolus 1,000 mL (1,000 mLs Intravenous New Bag/Given 12/04/23 1417)  ondansetron  (ZOFRAN ) injection 4 mg (4 mg Intravenous Given 12/04/23 1417)  morphine  (PF) 2 MG/ML injection 2 mg (2 mg Intravenous Given 12/04/23 1418)                                    Medical Decision Making Amount and/or Complexity of Data Reviewed Labs: ordered. Radiology: ordered.  Risk Prescription drug management.   Patient presents due to abdominal pain,  nausea.  Has been having abdominal pain over the past couple days but seems to be increasing in nature.  Patient states that she will have random episodes of abdominal pain once or twice a week.  No predisposing factors that she is aware of.  No obvious food group that seems to make it worse.  She states that is not associated with any kind of menstrual cycle.  Patient states that today the abdominal pain got worse.  Had episodes of diarrhea this morning as well.  She maybe thought there might be a red tinge to the diarrhea but she is not really sure.  No dark blood.  No melena.  Endorses nausea but no vomiting.  No fever no chills.  Denies any dysuria.  No vaginal discharge.  States that maybe the past has been diagnosed with colitis.  No history of IBD or IBS  diagnosis.  Previous medical history reviewed : Patient was last seen in the ED in April 2025.  Was seen because of palpitations.  Reassuring lab work.   Upon exam, patient hemodynamically stable.  ANO x 3 GCS 15.  Afebrile.   Has some generalized pain to palpation of the abdomen.  No obvious rebound or guarding that I could appreciate.  My highest on the differential is IBS versus IBD including possible colitis.  No current concern for ovarion pathology such as torsion or cyst.  No vaginal discharge.  No concerns for PID.  No dysuria.  Very little concern for UTI.  No concerns for pyelonephritis.  Laboratory workup unremarkable.  Plan will be for CT scan.  Pending CT scan at time of signout.        Final diagnoses:  Generalized abdominal pain  Diarrhea, unspecified type    ED Discharge Orders     None          Simon Lavonia SAILOR, MD 12/04/23 1501    Simon Lavonia SAILOR, MD 12/04/23 1501

## 2023-12-04 NOTE — ED Notes (Signed)
 Pt ambulatory to [facility] for [facility]

## 2023-12-04 NOTE — Discharge Instructions (Addendum)
 I do think you should follow-up with gastroenterology and your primary doctor given repeat episodes of abdominal pain.  This could be related to IBS which is irritable bowel syndrome.  This can cause a constellation of symptoms including diarrhea, constipation as well as abdominal pain.  Take tylenol  and ibuprofen  for your pain. Take the zofran  for your nausea.   Please give them a call to establish care.

## 2023-12-04 NOTE — ED Triage Notes (Signed)
 Pt reports lower abdominal pain X 3 days. Endorses diarrhea that started today. Reports nausea

## 2024-06-10 ENCOUNTER — Emergency Department (HOSPITAL_BASED_OUTPATIENT_CLINIC_OR_DEPARTMENT_OTHER): Admission: EM | Admit: 2024-06-10 | Discharge: 2024-06-10 | Disposition: A | Discharge status: Home / Self Care

## 2024-06-10 ENCOUNTER — Other Ambulatory Visit: Payer: Self-pay

## 2024-06-10 ENCOUNTER — Encounter (HOSPITAL_BASED_OUTPATIENT_CLINIC_OR_DEPARTMENT_OTHER): Payer: Self-pay | Admitting: Emergency Medicine

## 2024-06-10 DIAGNOSIS — M79605 Pain in left leg: Secondary | ICD-10-CM | POA: Insufficient documentation

## 2024-06-10 MED ORDER — NAPROXEN 250 MG PO TABS
500.0000 mg | ORAL_TABLET | Freq: Once | ORAL | Status: AC
  Administered 2024-06-10: 500 mg via ORAL
  Filled 2024-06-10: qty 2

## 2024-06-10 MED ORDER — NAPROXEN 250 MG PO TABS: 250.0000 mg | ORAL_TABLET | Freq: Four times a day (QID) | ORAL | 0 refills | Status: AC | PRN

## 2024-06-10 NOTE — Discharge Instructions (Addendum)
 It was a pleasure taking care of you today.  Based on your history and physical exam I feel you are stable for discharge.  Due to your history of blood clots in the right leg, I have ordered an ultrasound to assess for blood clots in your left leg.  You have been scheduled for 1 PM tomorrow, please arrive at 12:45 PM back here at [facility]  Following the study a provider should give you your results.  If you are positive for a blood clot you may need blood thinning medications.  In the meantime I have sent in a prescription for naproxen  which is an anti-inflammatory medication, please pick it up from your pharmacy and take as prescribed.  This medication is to treat pain and inflammation.  Please do not take over-the-counter ibuprofen  with this medication.  I have also attached the phone number for orthopedics, if you do not have a blood clot, please call the orthopedic office to make an appointment as you may may need further imaging of your left knee.  If you experience any of the following symptoms including but limited to inability to walk, worsening leg pain, redness, swelling, chest pain, shortness of breath, or other new concerning symptom please return to the emergency department or seek further medical care.  Please make your primary care provider aware of your workup and all findings today.

## 2024-06-11 ENCOUNTER — Emergency Department (HOSPITAL_BASED_OUTPATIENT_CLINIC_OR_DEPARTMENT_OTHER): Admit: 2024-06-11
